# Patient Record
Sex: Female | Born: 1945 | Race: White | Hispanic: No | Smoking: Never smoker
Health system: Southern US, Community
[De-identification: ages and names within clinical notes are randomized; demographics above are authoritative.]

## PROBLEM LIST (undated history)

## (undated) DIAGNOSIS — I10 Essential (primary) hypertension: Secondary | ICD-10-CM

## (undated) DIAGNOSIS — J302 Other seasonal allergic rhinitis: Secondary | ICD-10-CM

## (undated) DIAGNOSIS — E039 Hypothyroidism, unspecified: Secondary | ICD-10-CM

## (undated) HISTORY — DX: Essential (primary) hypertension: I10

## (undated) HISTORY — DX: Hypothyroidism, unspecified: E03.9

## (undated) HISTORY — DX: Other seasonal allergic rhinitis: J30.2

---

## 2021-07-14 ENCOUNTER — Encounter: Payer: Self-pay | Admitting: Physician Assistant

## 2021-07-21 ENCOUNTER — Other Ambulatory Visit (INDEPENDENT_AMBULATORY_CARE_PROVIDER_SITE_OTHER): Payer: Medicare PPO

## 2021-07-21 ENCOUNTER — Ambulatory Visit: Payer: Medicare PPO | Admitting: Physician Assistant

## 2021-07-21 ENCOUNTER — Encounter: Payer: Self-pay | Admitting: Physician Assistant

## 2021-07-21 VITALS — BP 128/72 | HR 70 | Ht 61.0 in | Wt 151.0 lb

## 2021-07-21 DIAGNOSIS — G309 Alzheimer's disease, unspecified: Secondary | ICD-10-CM | POA: Diagnosis not present

## 2021-07-21 DIAGNOSIS — R413 Other amnesia: Secondary | ICD-10-CM

## 2021-07-21 DIAGNOSIS — F0280 Dementia in other diseases classified elsewhere without behavioral disturbance: Secondary | ICD-10-CM

## 2021-07-21 LAB — VITAMIN B12: Vitamin B-12: 263 pg/mL (ref 211–911)

## 2021-07-21 MED ORDER — DONEPEZIL HCL 10 MG PO TABS: ORAL_TABLET | ORAL | 11 refills | Status: DC

## 2021-07-21 NOTE — Progress Notes (Signed)
Please inform the patient that her B12 is on the lower side at 263, needs replenishment with 1 tablet, 1000 mcg daily, follow-up with primary care physician thank you
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
## 2021-07-21 NOTE — Patient Instructions (Signed)
It was a pleasure to see you today at our office.  ? ?Recommendations: ? ?Follow up in  3 months ?We will start donepezil half tablet (5mg ) daily for 2  weeks.  If you are tolerating the medication, then after 2 weeks, we will increase the dose to a full tablet of 10 mg daily.  Side effects include nausea, vomiting, diarrhea, vivid dreams, and muscle cramps.  Please call the clinic if you experience any of these symptoms.  ?MRI brain to look inside  ?Check B12 today ? ? ?Whom to call: ? ?Memory  decline, memory medications: Call out office [phone]  ? ?For psychiatric meds, mood meds: Please have your primary care physician manage these medications.  ? ?Counseling regarding caregiver distress, including caregiver depression, anxiety and issues regarding community resources, adult day care programs, adult living facilities, or memory care questions:   Feel free to contact Misty [phone], Social Worker at [phone] ?  ?For assessment of decision of mental capacity and competency:  Call Dr. [phone], geriatric psychiatrist at [phone] ? ?For guidance in geriatric dementia issues please call [facility] [phone] ? ?If you have any severe symptoms of a stroke, or other severe issues such as confusion,severe chills or fever, etc call 911 or go to the ER as you may need to be evaluate further ? ? ?Feel free to visit Facebook page " [employer]" for tips of how to care for people with memory problems.  ? ? ? ? ?RECOMMENDATIONS FOR ALL PATIENTS WITH MEMORY PROBLEMS: ?1. Continue to exercise (Recommend 30 minutes of walking everyday, or 3 hours every week) ?2. Increase social interactions - continue going to [place] and enjoy social gatherings with friends and family ?3. Eat healthy, avoid fried foods and eat more fruits and vegetables ?4. Maintain adequate blood pressure, blood sugar, and blood cholesterol level. Reducing the risk of stroke and cardiovascular disease also helps promoting  better memory. ?5. Avoid stressful situations. Live a simple life and avoid aggravations. Organize your time and prepare for the next day in anticipation. ?6. Sleep well, avoid any interruptions of sleep and avoid any distractions in the bedroom that may interfere with adequate sleep quality ?7. Avoid sugar, avoid sweets as there is a strong link between excessive sugar intake, diabetes, and cognitive impairment ?We discussed the Mediterranean diet, which has been shown to help patients reduce the risk of progressive memory disorders and reduces cardiovascular risk. This includes eating fish, eat fruits and green leafy vegetables, nuts like almonds and hazelnuts, walnuts, and also use olive oil. Avoid fast foods and fried foods as much as possible. Avoid sweets and sugar as sugar use has been linked to worsening of memory function. ? ?There is always a concern of gradual progression of memory problems. If this is the case, then we may need to adjust level of care according to patient needs. Support, both to the patient and caregiver, should then be put into place.  ? ? ?The Alzheimer?s Association is here all day, every day for people facing Alzheimer?s disease through our free 24/7 Helpline: [phone]. The Helpline provides reliable information and support to all those who need assistance, such as individuals living with memory loss, Alzheimer's or other dementia, caregivers, health care professionals and the public.  ?Our highly trained and knowledgeable staff can help you with: ?Understanding memory loss, dementia and Alzheimer's  ?Medications and other treatment options  ?General information about aging and brain health  ?Skills to provide quality care and  to find the best care from professionals  ?Legal, financial and living-arrangement decisions ?Our Helpline also features: ?Confidential care consultation provided by master's level clinicians who can help with decision-making support, crisis assistance and  education on issues families face every day  ?Help in a caller's preferred language using our translation service that features more than 200 languages and dialects  ?Referrals to local community programs, services and ongoing support ? ? ? ? ?FALL PRECAUTIONS: Be cautious when walking. Scan the area for obstacles that may increase the risk of trips and falls. When getting up in the mornings, sit up at the edge of the bed for a few minutes before getting out of bed. Consider elevating the bed at the head end to avoid drop of blood pressure when getting up. Walk always in a well-lit room (use night lights in the walls). Avoid area rugs or power cords from appliances in the middle of the walkways. Use a walker or a cane if necessary and consider physical therapy for balance exercise. Get your eyesight checked regularly. ? ?FINANCIAL OVERSIGHT: Supervision, especially oversight when making financial decisions or transactions is also recommended. ? ?HOME SAFETY: Consider the safety of the kitchen when operating appliances like stoves, microwave oven, and blender. Consider having supervision and share cooking responsibilities until no longer able to participate in those. Accidents with firearms and other hazards in the house should be identified and addressed as well. ? ? ?ABILITY TO BE LEFT ALONE: If patient is unable to contact 911 operator, consider using LifeLine, or when the need is there, arrange for someone to stay with patients. Smoking is a fire hazard, consider supervision or cessation. Risk of wandering should be assessed by caregiver and if detected at any point, supervision and safe proof recommendations should be instituted. ? ?MEDICATION SUPERVISION: Inability to self-administer medication needs to be constantly addressed. Implement a mechanism to ensure safe administration of the medications. ? ? ?DRIVING: Regarding driving, in patients with progressive memory problems, driving will be impaired. We advise  to have someone else do the driving if trouble finding directions or if minor accidents are reported. Independent driving assessment is available to determine safety of driving. ? ? ?If you are interested in the driving assessment, you can contact the following: ? ?[employer] [place] [phone] ? ?[facility] [phone] ? ?[facility] [phone] ? ?[facility] [phone] or [phone] ? ?  ? ? ?Mediterranean Diet ?A Mediterranean diet refers to food and lifestyle choices that are based on the traditions of countries located on the [place]. This way of eating has been shown to help prevent certain conditions and improve outcomes for people who have chronic diseases, like kidney disease and heart disease. ?What are tips for following this plan? ?Lifestyle  ?Cook and eat meals together with your family, when possible. ?Drink enough fluid to keep your urine clear or pale yellow. ?Be physically active every day. This includes: ?Aerobic exercise like running or swimming. ?Leisure activities like gardening, walking, or housework. ?Get 7-8 hours of sleep each night. ?If recommended by your health care provider, drink red wine in moderation. This means 1 glass a day for nonpregnant women and 2 glasses a day for men. A glass of wine equals 5 oz (150 mL). ?Reading food labels  ?Check the serving size of packaged foods. For foods such as rice and pasta, the serving size refers to the amount of cooked product, not dry. ?Check the total fat in packaged foods.  Avoid foods that have saturated fat or trans fats. ?Check the ingredients list for added sugars, such as corn syrup. ?Shopping  ?At the grocery store, buy most of your food from the areas near the walls of the store. This includes: ?Fresh fruits and vegetables (produce). ?Grains, beans, nuts, and seeds. Some of these may be available in unpackaged forms or large amounts (in bulk). ?Fresh  seafood. ?Poultry and eggs. ?Low-fat dairy products. ?Buy whole ingredients instead of prepackaged foods. ?Buy fresh fruits and vegetables in-season from local farmers markets. ?Buy frozen fruits and vegetables in re

## 2021-08-02 ENCOUNTER — Ambulatory Visit
Admission: RE | Admit: 2021-08-02 | Discharge: 2021-08-02 | Disposition: A | Discharge status: Home / Self Care | Payer: Medicare PPO | Source: Ambulatory Visit | Attending: Physician Assistant | Admitting: Physician Assistant

## 2021-08-02 DIAGNOSIS — R413 Other amnesia: Secondary | ICD-10-CM

## 2021-08-02 IMAGING — MR MR HEAD W/O CM
11 series · 48 of 48 positions shown · non-contrast
Comparison: No pertinent prior exams available for comparison.

CLINICAL DATA: Provided history: Memory loss.

EXAM:
MRI HEAD WITHOUT CONTRAST
TECHNIQUE: Multiplanar, multiecho pulse sequences of the brain and surrounding
structures were obtained without intravenous contrast.

[Series 5: T1 · sagittal · 4.0mm · 0.75mm/px · 2 of 31 slices shown (1 of 2)]
[im 1/31]
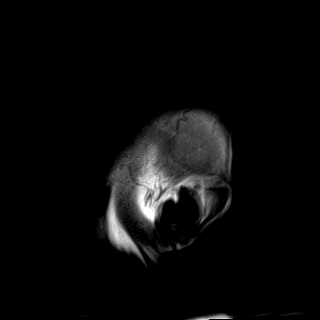
[im 31/31]
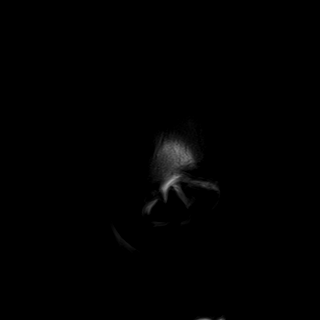

[Series 6: DWI · axial · 3.0mm · 0.94mm/px · z∈[-48,+92]mm · 10 of 160 slices shown (1 of 3)]
[im 1/160]
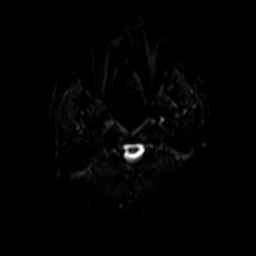
[im 18/160]
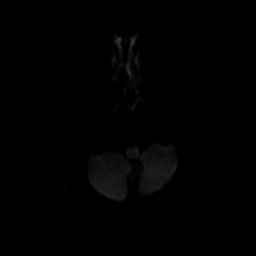
[im 36/160]
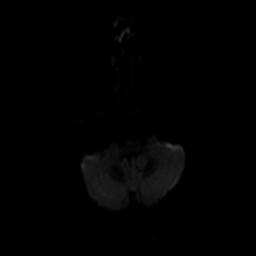
[im 54/160]
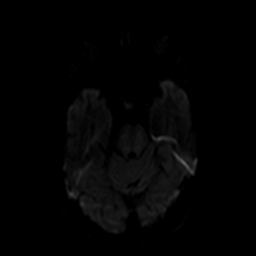
[im 71/160]
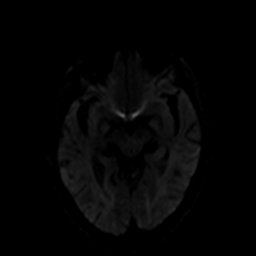
[im 89/160]
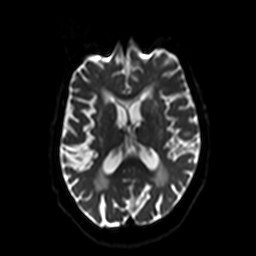
[im 107/160]
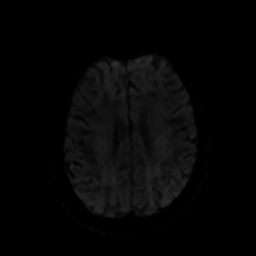
[im 124/160]
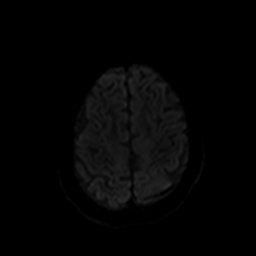
[im 142/160]
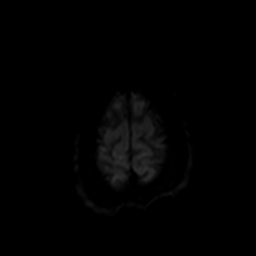
[im 160/160]
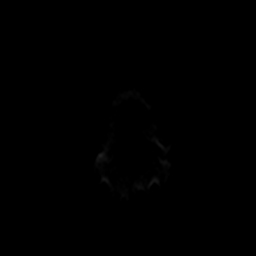

[Series 7: ax dwi_tracew · axial · 3.0mm · 0.94mm/px · z∈[-48,+92]mm · 5 of 80 slices shown]
[im 1/80]
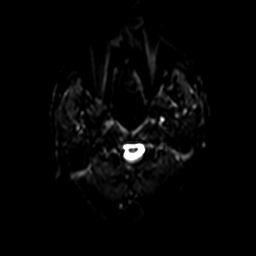
[im 20/80]
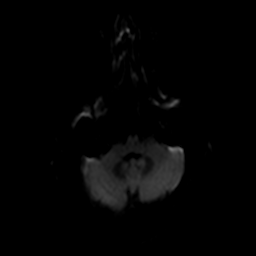
[im 40/80]
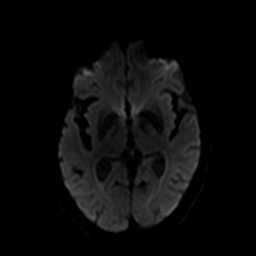
[im 60/80]
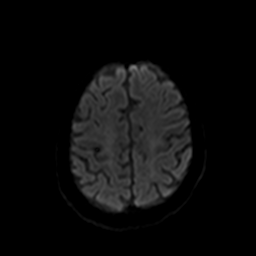
[im 80/80]
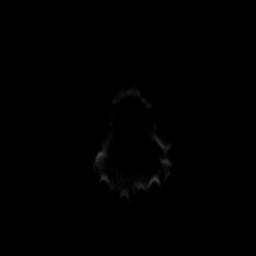

[Series 8: ax dwi_adc · axial · 3.0mm · 0.94mm/px · z∈[-48,+92]mm · 3 of 40 slices shown]
[im 1/40]
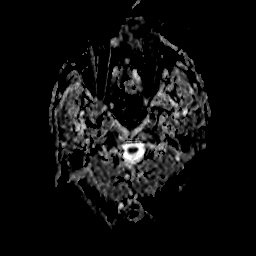
[im 20/40]
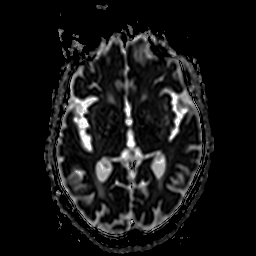
[im 40/40]
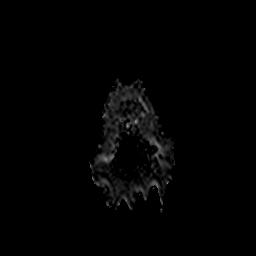

[Series 9: DWI · coronal · 5.0mm · 1.44mm/px · 4 of 60 slices shown (2 of 3)]
[im 1/60]
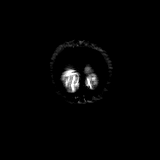
[im 20/60]
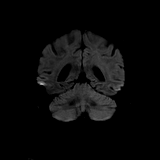
[im 40/60]
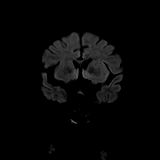
[im 60/60]
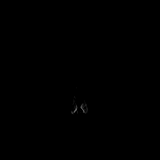

[Series 10: DWI · coronal · 5.0mm · 1.44mm/px · 2 of 29 slices shown (3 of 3)]
[im 1/29]
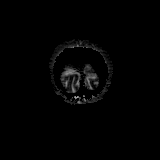
[im 29/29]
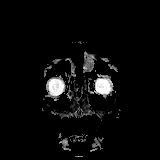

[Series 11: T2 · axial · 4.0mm · 0.36mm/px · z∈[-46,+89]mm · 2 of 26 slices shown (1 of 2)]
[im 1/26]
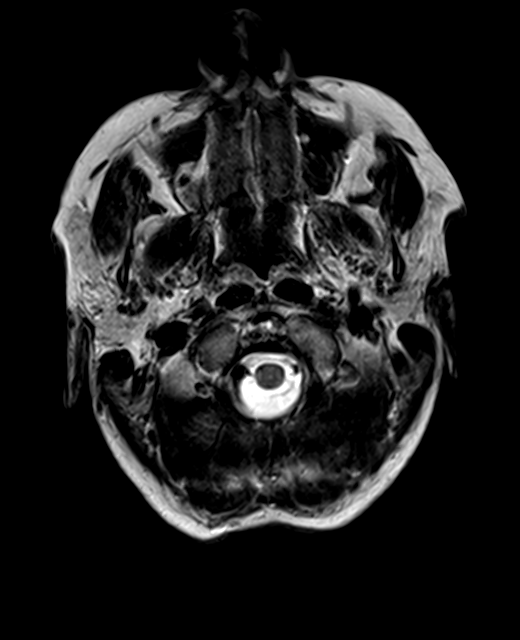
[im 26/26]
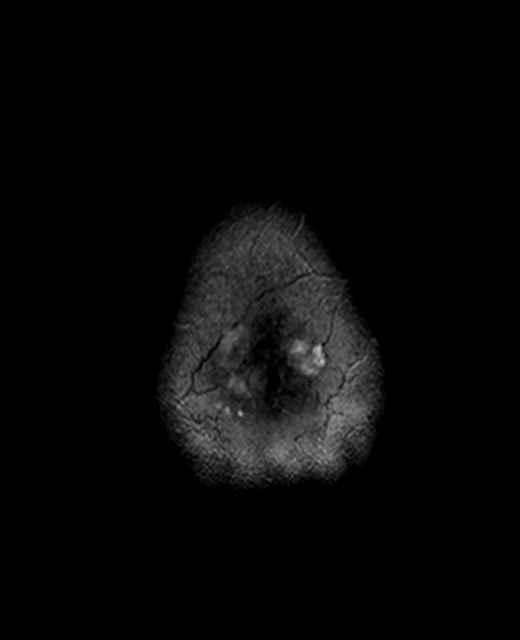

[Series 12: FLAIR · axial · 3.0mm · 0.72mm/px · z∈[-53,+96]mm · 2 of 26 slices shown]
[im 1/26]
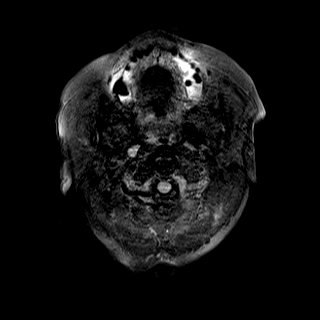
[im 26/26]
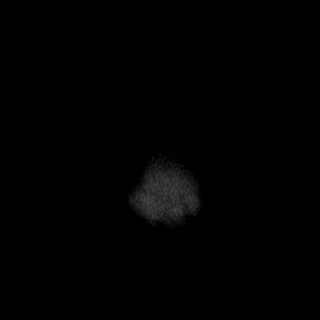

[Series 14: swi_images · axial · 1.5mm · 0.90mm/px · z∈[-49,+92]mm · 6 of 96 slices shown]
[im 1/96]
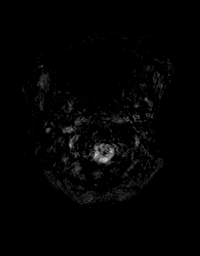
[im 20/96]
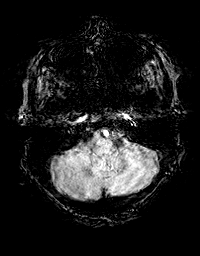
[im 39/96]
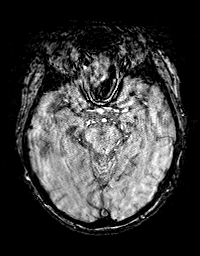
[im 58/96]
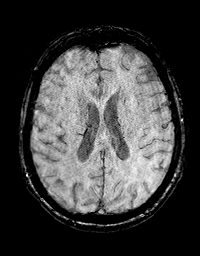
[im 77/96]
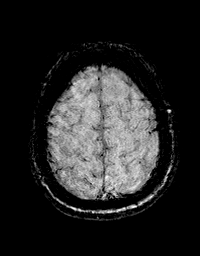
[im 96/96]
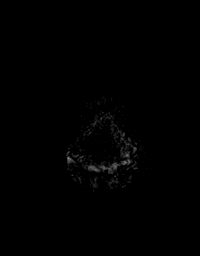

[Series 15: T1 · axial · 1.0mm · 0.94mm/px · z∈[-67,+90]mm · 10 of 158 slices shown (2 of 2)]
[im 1/158]
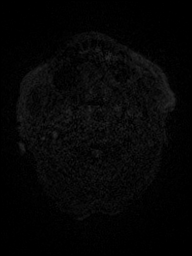
[im 18/158]
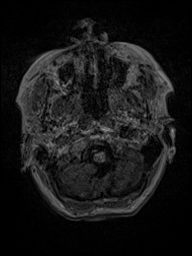
[im 35/158]
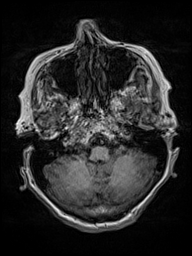
[im 53/158]
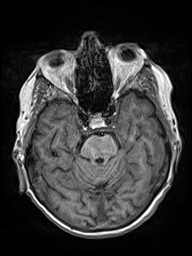
[im 70/158]
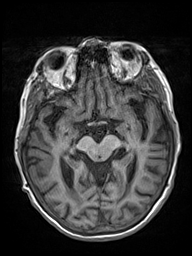
[im 88/158]
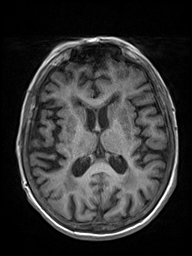
[im 105/158]
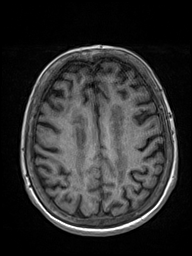
[im 123/158]
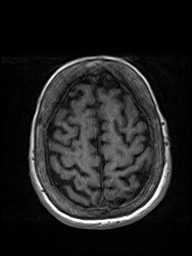
[im 140/158]
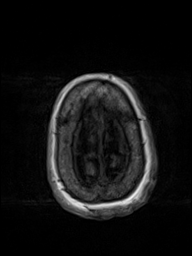
[im 158/158]
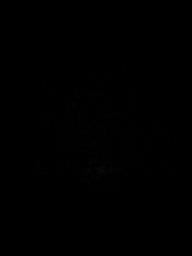

[Series 16: T2 · coronal · 4.5mm · 0.36mm/px · 2 of 30 slices shown (2 of 2)]
[im 1/30]
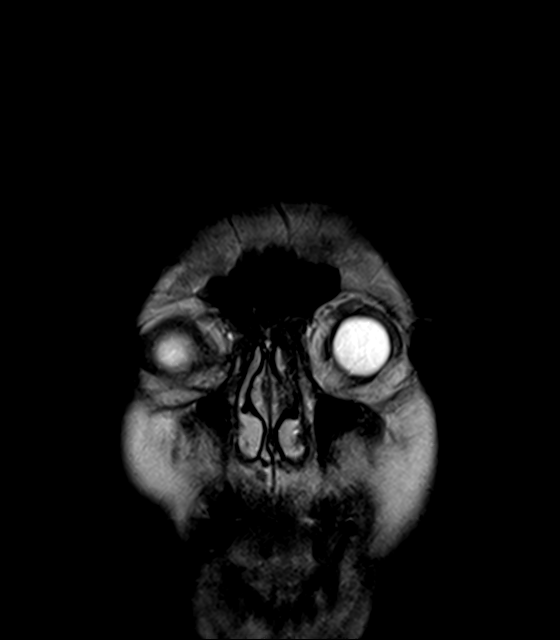
[im 30/30]
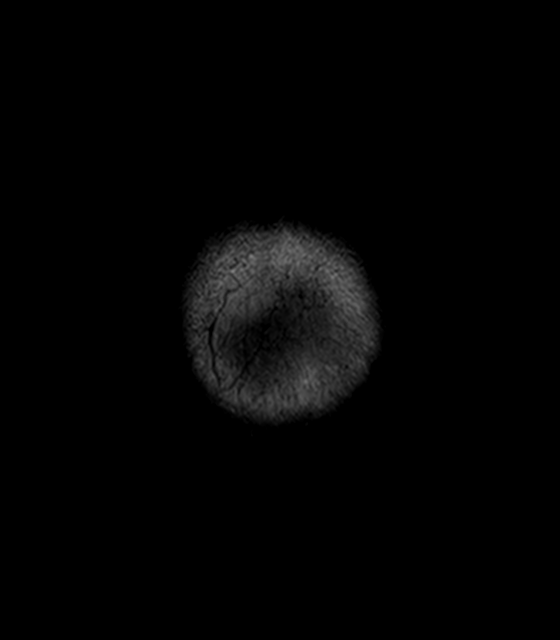

[48 of 48 positions shown; findings below may reference images not displayed]

FINDINGS: Mild-to-moderate intermittent motion degradation.

Brain:

Mild generalized cerebral atrophy.

Advanced patchy and confluent T2 FLAIR hyperintense signal
abnormality within the cerebral white matter, nonspecific but
compatible with chronic small vessel ischemic disease. Mild chronic
small vessel ischemic changes within the pons.

Multiple tiny chronic infarcts within the bilateral cerebellar
hemispheres.

There is no acute infarct.

No evidence of an intracranial mass.

No chronic intracranial blood products.

No extra-axial fluid collection.

No midline shift.

Vascular: Maintained flow voids within the proximal large arterial
vessels. Developmental venous anomaly within the posterior right
frontal lobe (anatomic variant).

Skull and upper cervical spine: No focal suspicious marrow lesion.

Sinuses/Orbits: No mass or acute finding within the imaged orbits.
Prior bilateral ocular lens replacement. Trace mucosal thickening
within the bilateral ethmoid and maxillary sinuses.
IMPRESSION: Intermittently motion degraded exam.

No evidence of acute intracranial abnormality.

Chronic small-vessel ischemic changes which are advanced in the
cerebral white matter, and mild in the pons.

Multiple tiny chronic infarcts within the bilateral cerebellar
hemispheres.

Mild generalized cerebral atrophy.

## 2021-08-05 NOTE — Progress Notes (Signed)
Please inform patient that her brain MRI does not show any acute findings. Only some chronic vessel changes, mild atrophy of the brain, and tiny tiny old infarcts in the cerebellum. Make sure you control your cardiovascular risk factors and talk to your doctor about taking a baby aspirin daily, to protect you. Thanks

## 2021-08-10 ENCOUNTER — Encounter: Payer: Self-pay | Admitting: Physician Assistant

## 2021-08-24 ENCOUNTER — Other Ambulatory Visit: Payer: Self-pay | Admitting: Physician Assistant

## 2021-08-24 MED ORDER — MEMANTINE HCL 5 MG PO TABS: ORAL_TABLET | ORAL | 11 refills | Status: DC

## 2021-08-24 NOTE — Telephone Encounter (Signed)
We can try memantine and see how she tolerates it. Will prescribe one tablet at night for 2 weeks, then increase to one tablet twice daily

## 2021-08-24 NOTE — Progress Notes (Signed)
Memantine 5 mg bid #60  11 refills sent to [facility]

## 2021-10-22 ENCOUNTER — Ambulatory Visit: Payer: Medicare PPO | Admitting: Physician Assistant

## 2021-10-30 ENCOUNTER — Encounter: Payer: Self-pay | Admitting: Physician Assistant

## 2021-10-30 ENCOUNTER — Ambulatory Visit: Payer: Medicare PPO | Admitting: Physician Assistant

## 2021-10-30 VITALS — BP 114/72 | HR 66 | Resp 18 | Ht 61.0 in | Wt 160.0 lb

## 2021-10-30 DIAGNOSIS — G309 Alzheimer's disease, unspecified: Secondary | ICD-10-CM

## 2021-10-30 DIAGNOSIS — F0280 Dementia in other diseases classified elsewhere without behavioral disturbance: Secondary | ICD-10-CM

## 2021-10-30 MED ORDER — MEMANTINE HCL 10 MG PO TABS: 10.0000 mg | ORAL_TABLET | Freq: Two times a day (BID) | ORAL | 11 refills | Status: DC

## 2021-10-30 NOTE — Patient Instructions (Signed)
It was a pleasure to see you today at our office.   Recommendations:  Follow up in 40months Increase memantine to 10 mg two times a day Continue primidone    Whom to call:  Memory  decline, memory medications: Call out office [phone]   For psychiatric meds, mood meds: Please have your primary care physician manage these medications.   Counseling regarding caregiver distress, including caregiver depression, anxiety and issues regarding community resources, adult day care programs, adult living facilities, or memory care questions:   Feel free to contact Misty Lisabeth Register, Social Worker at [phone]   For assessment of decision of mental capacity and competency:  Call Dr. Erick Blinks, geriatric psychiatrist at [phone]  For guidance in geriatric dementia issues please call [facility] [phone]  If you have any severe symptoms of a stroke, or other severe issues such as confusion,severe chills or fever, etc call 911 or go to the ER as you may need to be evaluate further   Feel free to visit Facebook page " [email or web address]" for tips of how to care for people with memory problems.      RECOMMENDATIONS FOR ALL PATIENTS WITH MEMORY PROBLEMS: 1. Continue to exercise (Recommend 30 minutes of walking everyday, or 3 hours every week) 2. Increase social interactions - continue going to [place] and enjoy social gatherings with friends and family 3. Eat healthy, avoid fried foods and eat more fruits and vegetables 4. Maintain adequate blood pressure, blood sugar, and blood cholesterol level. Reducing the risk of stroke and cardiovascular disease also helps promoting better memory. 5. Avoid stressful situations. Live a simple life and avoid aggravations. Organize your time and prepare for the next day in anticipation. 6. Sleep well, avoid any interruptions of sleep and avoid any distractions in the bedroom that may interfere with adequate sleep quality 7. Avoid sugar,  avoid sweets as there is a strong link between excessive sugar intake, diabetes, and cognitive impairment We discussed the Mediterranean diet, which has been shown to help patients reduce the risk of progressive memory disorders and reduces cardiovascular risk. This includes eating fish, eat fruits and green leafy vegetables, nuts like almonds and hazelnuts, walnuts, and also use olive oil. Avoid fast foods and fried foods as much as possible. Avoid sweets and sugar as sugar use has been linked to worsening of memory function.  There is always a concern of gradual progression of memory problems. If this is the case, then we may need to adjust level of care according to patient needs. Support, both to the patient and caregiver, should then be put into place.    The Alzheimer's Association is here all day, every day for people facing Alzheimer's disease through our free 24/7 Helpline: [phone]. The Helpline provides reliable information and support to all those who need assistance, such as individuals living with memory loss, Alzheimer's or other dementia, caregivers, health care professionals and the public.  Our highly trained and knowledgeable staff can help you with: Understanding memory loss, dementia and Alzheimer's  Medications and other treatment options  General information about aging and brain health  Skills to provide quality care and to find the best care from professionals  Legal, financial and living-arrangement decisions Our Helpline also features: Confidential care consultation provided by master's level clinicians who can help with decision-making support, crisis assistance and education on issues families face every day  Help in a caller's preferred language using our translation service that features more than 200 languages and dialects  Referrals to local community programs, services and ongoing support     FALL PRECAUTIONS: Be cautious when walking. Scan the area for  obstacles that may increase the risk of trips and falls. When getting up in the mornings, sit up at the edge of the bed for a few minutes before getting out of bed. Consider elevating the bed at the head end to avoid drop of blood pressure when getting up. Walk always in a well-lit room (use night lights in the walls). Avoid area rugs or power cords from appliances in the middle of the walkways. Use a walker or a cane if necessary and consider physical therapy for balance exercise. Get your eyesight checked regularly.  FINANCIAL OVERSIGHT: Supervision, especially oversight when making financial decisions or transactions is also recommended.  HOME SAFETY: Consider the safety of the kitchen when operating appliances like stoves, microwave oven, and blender. Consider having supervision and share cooking responsibilities until no longer able to participate in those. Accidents with firearms and other hazards in the house should be identified and addressed as well.   ABILITY TO BE LEFT ALONE: If patient is unable to contact 911 operator, consider using LifeLine, or when the need is there, arrange for someone to stay with patients. Smoking is a fire hazard, consider supervision or cessation. Risk of wandering should be assessed by caregiver and if detected at any point, supervision and safe proof recommendations should be instituted.  MEDICATION SUPERVISION: Inability to self-administer medication needs to be constantly addressed. Implement a mechanism to ensure safe administration of the medications.   DRIVING: Regarding driving, in patients with progressive memory problems, driving will be impaired. We advise to have someone else do the driving if trouble finding directions or if minor accidents are reported. Independent driving assessment is available to determine safety of driving.   If you are interested in the driving assessment, you can contact the following:  [employer] in [place]  [phone]  [facility] [phone]  [facility] [phone] or [phone]      Mediterranean Diet A Mediterranean diet refers to food and lifestyle choices that are based on the traditions of countries located on the [employer]. This way of eating has been shown to help prevent certain conditions and improve outcomes for people who have chronic diseases, like kidney disease and heart disease. What are tips for following this plan? Lifestyle  Cook and eat meals together with your family, when possible. Drink enough fluid to keep your urine clear or pale yellow. Be physically active every day. This includes: Aerobic exercise like running or swimming. Leisure activities like gardening, walking, or housework. Get 7-8 hours of sleep each night. If recommended by your health care provider, drink red wine in moderation. This means 1 glass a day for nonpregnant women and 2 glasses a day for men. A glass of wine equals 5 oz (150 mL). Reading food labels  Check the serving size of packaged foods. For foods such as rice and pasta, the serving size refers to the amount of cooked product, not dry. Check the total fat in packaged foods. Avoid foods that have saturated fat or trans fats. Check the ingredients list for added sugars, such as corn syrup. Shopping  At the grocery store, buy most of your food from the areas near the walls of the store. This includes: Fresh fruits and vegetables (produce). Grains, beans, nuts, and seeds. Some of these may be available in unpackaged forms  or large amounts (in bulk). Fresh seafood. Poultry and eggs. Low-fat dairy products. Buy whole ingredients instead of prepackaged foods. Buy fresh fruits and vegetables in-season from local farmers markets. Buy frozen fruits and vegetables in resealable bags. If you do not have access to quality fresh seafood, buy precooked frozen shrimp or  canned fish, such as tuna, salmon, or sardines. Buy small amounts of raw or cooked vegetables, salads, or olives from the deli or salad bar at your store. Stock your pantry so you always have certain foods on hand, such as olive oil, canned tuna, canned tomatoes, rice, pasta, and beans. Cooking  Cook foods with extra-virgin olive oil instead of using butter or other vegetable oils. Have meat as a side dish, and have vegetables or grains as your main dish. This means having meat in small portions or adding small amounts of meat to foods like pasta or stew. Use beans or vegetables instead of meat in common dishes like chili or lasagna. Experiment with different cooking methods. Try roasting or broiling vegetables instead of steaming or sauteing them. Add frozen vegetables to soups, stews, pasta, or rice. Add nuts or seeds for added healthy fat at each meal. You can add these to yogurt, salads, or vegetable dishes. Marinate fish or vegetables using olive oil, lemon juice, garlic, and fresh herbs. Meal planning  Plan to eat 1 vegetarian meal one day each week. Try to work up to 2 vegetarian meals, if possible. Eat seafood 2 or more times a week. Have healthy snacks readily available, such as: Vegetable sticks with hummus. Greek yogurt. Fruit and nut trail mix. Eat balanced meals throughout the week. This includes: Fruit: 2-3 servings a day Vegetables: 4-5 servings a day Low-fat dairy: 2 servings a day Fish, poultry, or lean meat: 1 serving a day Beans and legumes: 2 or more servings a week Nuts and seeds: 1-2 servings a day Whole grains: 6-8 servings a day Extra-virgin olive oil: 3-4 servings a day Limit red meat and sweets to only a few servings a month What are my food choices? Mediterranean diet Recommended Grains: Whole-grain pasta. Brown rice. Bulgar wheat. Polenta. Couscous. Whole-wheat bread. Orpah Cobb. Vegetables: Artichokes. Beets. Broccoli. Cabbage. Carrots. Eggplant.  Green beans. Chard. Kale. Spinach. Onions. Leeks. Peas. Squash. Tomatoes. Peppers. Radishes. Fruits: Apples. Apricots. Avocado. Berries. Bananas. Cherries. Dates. Figs. Grapes. Lemons. Melon. Oranges. Peaches. Plums. Pomegranate. Meats and other protein foods: Beans. Almonds. Sunflower seeds. Pine nuts. Peanuts. Cod. Salmon. Scallops. Shrimp. Tuna. Tilapia. Clams. Oysters. Eggs. Dairy: Low-fat milk. Cheese. Greek yogurt. Beverages: Water. Red wine. Herbal tea. Fats and oils: Extra virgin olive oil. Avocado oil. Grape seed oil. Sweets and desserts: [place] yogurt with honey. Baked apples. Poached pears. Trail mix. Seasoning and other foods: Basil. Cilantro. Coriander. Cumin. Mint. Parsley. Sage. Rosemary. Tarragon. Garlic. Oregano. Thyme. Pepper. Balsalmic vinegar. Tahini. Hummus. Tomato sauce. Olives. Mushrooms. Limit these Grains: Prepackaged pasta or rice dishes. Prepackaged cereal with added sugar. Vegetables: Deep fried potatoes (french fries). Fruits: Fruit canned in syrup. Meats and other protein foods: Beef. Pork. Lamb. Poultry with skin. Hot dogs. Tomasa Blase. Dairy: Ice cream. Sour cream. Whole milk. Beverages: Juice. Sugar-sweetened soft drinks. Beer. Liquor and spirits. Fats and oils: Butter. Canola oil. Vegetable oil. Beef fat (tallow). Lard. Sweets and desserts: Cookies. Cakes. Pies. Candy. Seasoning and other foods: Mayonnaise. Premade sauces and marinades. The items listed may not be a complete list. Talk with your dietitian about what dietary choices are right for you. Summary The Mediterranean diet includes both  food and lifestyle choices. Eat a variety of fresh fruits and vegetables, beans, nuts, seeds, and whole grains. Limit the amount of red meat and sweets that you eat. Talk with your health care provider about whether it is safe for you to drink red wine in moderation. This means 1 glass a day for nonpregnant women and 2 glasses a day for men. A glass of wine equals 5 oz (150  mL). This information is not intended to replace advice given to you by your health care provider. Make sure you discuss any questions you have with your health care provider. Document Released: 10/16/2015 Document Revised: 11/18/2015 Document Reviewed: 10/16/2015 Elsevier Interactive Patient Education  2017 [employer].

## 2021-10-30 NOTE — Progress Notes (Unsigned)
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 RH female seen today in follow up for memory loss. Patient is currently on MMSE today is   /30  with delayed recall  /3       Follow up in 6  months. Continue memantine 5 mg bid  Case discussed with Dr. Karel Jarvis who agrees with the plan   76 y.o. 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  MoCA today is 13/30, with delayed recall 1/5, and then deficiencies in most areas including visuospatial executive 1/5, memory, attention, language and abstraction.  She initially was seen at [facility] in [place] on Jan 2023, and after neuropsychological evaluation, it was indicated that she had mild cognitive impairment.  During today's visit with evaluation and MOCA 13/30, symptoms concerning for mild dementia likely due to vascular and Alzheimer's disease.  She is not on antidepressant medications.***       Subjective:    This patient is accompanied in the office by  who supplements the history.  Previous records as well as any outside records available were reviewed prior to todays visit.    Any changes in memory since last visit? Forgets people's name, or how to use the remote, cshe wanted to cook it instead of defrosting .  A lot of things that she was not doing before, like cleaning clothes on the floor, she put a blanket on the ground, etc. She is hoarding more.  Patient lives with: daughter and her grand-daughter Lelon Mast  repeats oneself?  Endorsed sometimes is about retelling a story  Disoriented when walking into a room?  Daughter reports that when she is unfamiliar places she may forget to close the door in a restaurant   Leaving objects in unusual places?  Patient denies   Ambulates  with difficulty?   Patient denies   Recent falls?  Patient denies   Any head injuries?  Patient denies   History of seizures?   Patient denies   Wandering behavior?  Patient denies   Patient drives?   Patient no longer drives  Any mood changes since last visit?  Any worsening depression?:  Patient denies   Hallucinations?  Patient denies   Paranoia?  Patient denies , but she worries obsessively about things. She bought 3 cakes.  Patient reports that sleeps well without vivid dreams, REM behavior or sleepwalking   History of sleep apnea?  Wears CPAP every night  Any hygiene concerns?  Patient denies  Lavetta Nielsen to remind her sometimes Independent of bathing and dressing?  Endorsed sometimes she picks things they don't match Does the patient needs help with medications?  Dtr fills the pilbox  Who is in charge of the finances?   is in charge    Any changes in appetite?  Patient denies   Patient have trouble swallowing? Patient denies   Does the patient cook?  Patient denies   Any kitchen accidents such as leaving the stove on? Patient denies   Any headaches?  Patient denies   Double vision? Patient denies   Any focal numbness or tingling?  Patient denies   Chronic back pain Patient denies   Unilateral weakness?  Patient denies   Any tremors?  R  hand tremor  Any history of anosmia?  Patient denies   Any incontinence of urine? Wears depends  Any bowel dysfunction?   Constipation    PREVIOUS MEDICATIONS:   CURRENT MEDICATIONS:  Outpatient Encounter Medications as of 10/30/2021  Medication Sig   amLODipine (NORVASC) 10 MG tablet Take by mouth.   levothyroxine (SYNTHROID) 100 MCG tablet Take by mouth. Takes every other day   levothyroxine (SYNTHROID) 112 MCG tablet Take by mouth. Takes every other day   losartan (COZAAR) 50 MG tablet Take by mouth.   Melatonin 1 MG CAPS Take by mouth.   memantine (NAMENDA) 5 MG tablet Take  1 tablet (5 mg at night) for 2 weeks, then increase to 1 tablet (5 mg) twice a day   omeprazole (PRILOSEC) 40 MG capsule Take by mouth.   potassium chloride (KLOR-CON) 8 MEQ tablet Take by mouth.   pravastatin (PRAVACHOL) 40 MG tablet Take by mouth.   predniSONE (DELTASONE) 20 MG tablet Take by mouth.   primidone (MYSOLINE) 50 MG tablet Take by mouth.   No facility-administered encounter medications on file as of 10/30/2021.        No data to display            07/21/2021   10:00 AM  Montreal Cognitive Assessment   Visuospatial/ Executive (0/5) 1  Naming (0/3) 3  Attention: Read list of digits (0/2) 0  Attention: Read list of letters (0/1) 0  Attention: Serial 7 subtraction starting at 100 (0/3) 0  Language: Repeat phrase (0/2) 1  Language : Fluency (0/1) 0  Abstraction (0/2) 1  Delayed Recall (0/5) 1  Orientation (0/6) 5  Total 12  Adjusted Score (based on education) 13    Objective:     PHYSICAL EXAMINATION:    VITALS:  There were no vitals filed for this visit.  GEN:  The patient appears stated age and is in NAD. HEENT:  Normocephalic, atraumatic.   Neurological examination:  General: NAD, well-groomed, appears stated age. Orientation: The patient is alert. Oriented to person, place and date Cranial nerves: There is good facial symmetry.The speech is fluent and clear. No aphasia or dysarthria. Fund of knowledge is appropriate. Recent and remote memory are impaired. Attention and concentration are reduced.  Able to name objects and repeat phrases.  Hearing is intact to conversational tone.    Sensation: Sensation is intact to light touch throughout Motor: Strength is at least antigravity x4. Tremors: none  DTR's 2/4 in UE/LE     Movement examination: Tone: There is normal tone in the UE/LE Abnormal movements:  no tremor.  No myoclonus.  No asterixis.   Coordination:  There is no decremation with RAM's. Normal finger to nose  Gait and Station: The patient has  no difficulty arising out of a deep-seated chair without the use of the hands. The patient's stride length is good.  Gait is cautious and narrow.    Thank you for allowing [place] the opportunity to participate in the care of this nice patient. Please do not hesitate to contact us for any questions or concerns.   Total time spent on today's visit was *** minutes dedicated to this patient today, preparing to see patient, examining the patient, ordering tests and/or medications and counseling the patient, documenting clinical information in the EHR or other health record, independently interpreting results and communicating results to the patient/family, discussing treatment and goals, answering patient's questions and coordinating care.  Cc:  Malen Gauze, PA-C  Marlowe Kays 10/30/2021  12:55 PM

## 2021-11-03 ENCOUNTER — Encounter: Payer: Self-pay | Admitting: Physician Assistant

## 2021-11-04 ENCOUNTER — Other Ambulatory Visit: Payer: Self-pay | Admitting: Physician Assistant

## 2021-11-04 ENCOUNTER — Other Ambulatory Visit: Payer: Self-pay

## 2021-11-04 ENCOUNTER — Encounter: Payer: Self-pay | Admitting: Physician Assistant

## 2021-11-04 MED ORDER — PRIMIDONE 50 MG PO TABS: 100.0000 mg | ORAL_TABLET | Freq: Every evening | ORAL | 3 refills | Status: DC

## 2021-11-04 NOTE — Telephone Encounter (Signed)
Called and spoke tot patients daughter POA and she is going to double check the dose and mg of the primidone

## 2021-12-30 ENCOUNTER — Encounter: Payer: Self-pay | Admitting: Physician Assistant

## 2022-01-05 ENCOUNTER — Ambulatory Visit: Payer: Medicare PPO | Admitting: Physician Assistant

## 2022-01-05 ENCOUNTER — Encounter: Payer: Self-pay | Admitting: Physician Assistant

## 2022-01-05 VITALS — BP 125/76 | HR 65 | Resp 18 | Ht 61.0 in | Wt 172.0 lb

## 2022-01-05 DIAGNOSIS — R251 Tremor, unspecified: Secondary | ICD-10-CM | POA: Diagnosis not present

## 2022-01-05 DIAGNOSIS — F0280 Dementia in other diseases classified elsewhere without behavioral disturbance: Secondary | ICD-10-CM

## 2022-01-05 DIAGNOSIS — G309 Alzheimer's disease, unspecified: Secondary | ICD-10-CM | POA: Diagnosis not present

## 2022-01-05 NOTE — Patient Instructions (Addendum)
It was a pleasure to see you today at our office.   Recommendations:  Follow up 2/28 at 11:30  Continue  memantine to 10 mg two times a day Continue primidone 100 mg at night  Recommend stress management     Whom to call:  Memory  decline, memory medications: Call out office [phone]   For psychiatric meds, mood meds: Please have your primary care physician manage these medications.   Counseling regarding caregiver distress, including caregiver depression, anxiety and issues regarding community resources, adult day care programs, adult living facilities, or memory care questions:   Feel free to contact [place], Social Worker at [phone]   For assessment of decision of mental capacity and competency:  Call Dr. Anthoney Harada, geriatric psychiatrist at [phone]  For guidance in geriatric dementia issues please call [facility] [phone]  If you have any severe symptoms of a stroke, or other severe issues such as confusion,severe chills or fever, etc call 911 or go to the ER as you may need to be evaluate further   Feel free to visit Facebook page " [email or web address]" for tips of how to care for people with memory problems.      RECOMMENDATIONS FOR ALL PATIENTS WITH MEMORY PROBLEMS: 1. Continue to exercise (Recommend 30 minutes of walking everyday, or 3 hours every week) 2. Increase social interactions - continue going to [place] and enjoy social gatherings with friends and family 3. Eat healthy, avoid fried foods and eat more fruits and vegetables 4. Maintain adequate blood pressure, blood sugar, and blood cholesterol level. Reducing the risk of stroke and cardiovascular disease also helps promoting better memory. 5. Avoid stressful situations. Live a simple life and avoid aggravations. Organize your time and prepare for the next day in anticipation. 6. Sleep well, avoid any interruptions of sleep and avoid any distractions in the bedroom that may  interfere with adequate sleep quality 7. Avoid sugar, avoid sweets as there is a strong link between excessive sugar intake, diabetes, and cognitive impairment We discussed the Mediterranean diet, which has been shown to help patients reduce the risk of progressive memory disorders and reduces cardiovascular risk. This includes eating fish, eat fruits and green leafy vegetables, nuts like almonds and hazelnuts, walnuts, and also use olive oil. Avoid fast foods and fried foods as much as possible. Avoid sweets and sugar as sugar use has been linked to worsening of memory function.  There is always a concern of gradual progression of memory problems. If this is the case, then we may need to adjust level of care according to patient needs. Support, both to the patient and caregiver, should then be put into place.    The Alzheimer's Association is here all day, every day for people facing Alzheimer's disease through our free 24/7 Helpline: [phone]. The Helpline provides reliable information and support to all those who need assistance, such as individuals living with memory loss, Alzheimer's or other dementia, caregivers, health care professionals and the public.  Our highly trained and knowledgeable staff can help you with: Understanding memory loss, dementia and Alzheimer's  Medications and other treatment options  General information about aging and brain health  Skills to provide quality care and to find the best care from professionals  Legal, financial and living-arrangement decisions Our Helpline also features: Confidential care consultation provided by master's level clinicians who can help with decision-making support, crisis assistance and education on issues families face every day  Help in a caller's preferred language  using our translation service that features more than 200 languages and dialects  Referrals to local community programs, services and ongoing support     FALL  PRECAUTIONS: Be cautious when walking. Scan the area for obstacles that may increase the risk of trips and falls. When getting up in the mornings, sit up at the edge of the bed for a few minutes before getting out of bed. Consider elevating the bed at the head end to avoid drop of blood pressure when getting up. Walk always in a well-lit room (use night lights in the walls). Avoid area rugs or power cords from appliances in the middle of the walkways. Use a walker or a cane if necessary and consider physical therapy for balance exercise. Get your eyesight checked regularly.  FINANCIAL OVERSIGHT: Supervision, especially oversight when making financial decisions or transactions is also recommended.  HOME SAFETY: Consider the safety of the kitchen when operating appliances like stoves, microwave oven, and blender. Consider having supervision and share cooking responsibilities until no longer able to participate in those. Accidents with firearms and other hazards in the house should be identified and addressed as well.   ABILITY TO BE LEFT ALONE: If patient is unable to contact 911 operator, consider using LifeLine, or when the need is there, arrange for someone to stay with patients. Smoking is a fire hazard, consider supervision or cessation. Risk of wandering should be assessed by caregiver and if detected at any point, supervision and safe proof recommendations should be instituted.  MEDICATION SUPERVISION: Inability to self-administer medication needs to be constantly addressed. Implement a mechanism to ensure safe administration of the medications.   DRIVING: Regarding driving, in patients with progressive memory problems, driving will be impaired. We advise to have someone else do the driving if trouble finding directions or if minor accidents are reported. Independent driving assessment is available to determine safety of driving.   If you are interested in the driving assessment, you can contact  the following:  [employer] in [place]  [place] [phone] or [phone]      [place] refers to food and lifestyle choices that are based on the traditions of countries located on the [employer]. This way of eating has been shown to help prevent certain conditions and improve outcomes for people who have chronic diseases, like kidney disease and heart disease. What are tips for following this plan? Lifestyle  Cook and eat meals together with your family, when possible. Drink enough fluid to keep your urine clear or pale yellow. Be physically active every day. This includes: Aerobic exercise like running or swimming. Leisure activities like gardening, walking, or housework. Get 7-8 hours of sleep each night. If recommended by your health care provider, drink red wine in moderation. This means 1 glass a day for nonpregnant women and 2 glasses a day for men. A glass of wine equals 5 oz (150 mL). Reading food labels  Check the serving size of packaged foods. For foods such as rice and pasta, the serving size refers to the amount of cooked product, not dry. Check the total fat in packaged foods. Avoid foods that have saturated fat or trans fats. Check the ingredients list for added sugars, such as corn syrup. Shopping  At the grocery store, buy most of your food from the areas near the walls of the store. This includes: Fresh fruits and vegetables (produce). Grains,  beans, nuts, and seeds. Some of these may be available in unpackaged forms or large amounts (in bulk). Fresh seafood. Poultry and eggs. Low-fat dairy products. Buy whole ingredients instead of prepackaged foods. Buy fresh fruits and vegetables in-season from local farmers markets. Buy frozen fruits and vegetables in resealable bags. If you do not have access to  quality fresh seafood, buy precooked frozen shrimp or canned fish, such as tuna, salmon, or sardines. Buy small amounts of raw or cooked vegetables, salads, or olives from the deli or salad bar at your store. Stock your pantry so you always have certain foods on hand, such as olive oil, canned tuna, canned tomatoes, rice, pasta, and beans. Cooking  Cook foods with extra-virgin olive oil instead of using butter or other vegetable oils. Have meat as a side dish, and have vegetables or grains as your main dish. This means having meat in small portions or adding small amounts of meat to foods like pasta or stew. Use beans or vegetables instead of meat in common dishes like chili or lasagna. Experiment with different cooking methods. Try roasting or broiling vegetables instead of steaming or sauteing them. Add frozen vegetables to soups, stews, pasta, or rice. Add nuts or seeds for added healthy fat at each meal. You can add these to yogurt, salads, or vegetable dishes. Marinate fish or vegetables using olive oil, lemon juice, garlic, and fresh herbs. Meal planning  Plan to eat 1 vegetarian meal one day each week. Try to work up to 2 vegetarian meals, if possible. Eat seafood 2 or more times a week. Have healthy snacks readily available, such as: Vegetable sticks with hummus. Greek yogurt. Fruit and nut trail mix. Eat balanced meals throughout the week. This includes: Fruit: 2-3 servings a day Vegetables: 4-5 servings a day Low-fat dairy: 2 servings a day Fish, poultry, or lean meat: 1 serving a day Beans and legumes: 2 or more servings a week Nuts and seeds: 1-2 servings a day Whole grains: 6-8 servings a day Extra-virgin olive oil: 3-4 servings a day Limit red meat and sweets to only a few servings a month What are my food choices? Mediterranean diet Recommended Grains: Whole-grain pasta. Brown rice. Bulgar wheat. Polenta. Couscous. Whole-wheat bread. [place]. Vegetables:  Artichokes. Beets. Broccoli. Cabbage. Carrots. Eggplant. Green beans. Chard. Kale. Spinach. Onions. Leeks. Peas. Squash. Tomatoes. Peppers. Radishes. Fruits: Apples. Apricots. Avocado. Berries. Bananas. Cherries. Dates. Figs. Grapes. Lemons. Melon. Oranges. Peaches. Plums. Pomegranate. Meats and other protein foods: Beans. Almonds. Sunflower seeds. Pine nuts. Peanuts. [place]. Salmon. Scallops. Shrimp. [place]. Tilapia. Clams. Oysters. Eggs. Dairy: Low-fat milk. Cheese. Greek yogurt. Beverages: Water. Red wine. Herbal tea. Fats and oils: Extra virgin olive oil. Avocado oil. Grape seed oil. Sweets and desserts: [place] yogurt with honey. Baked apples. Poached pears. Trail mix. Seasoning and other foods: Basil. Cilantro. Coriander. Cumin. Mint. Parsley. Sage. Rosemary. Tarragon. Garlic. Oregano. Thyme. Pepper. Balsalmic vinegar. Tahini. Hummus. Tomato sauce. Olives. Mushrooms. Limit these Grains: Prepackaged pasta or rice dishes. Prepackaged cereal with added sugar. Vegetables: Deep fried potatoes (french fries). Fruits: Fruit canned in syrup. Meats and other protein foods: Beef. Pork. Lamb. Poultry with skin. Hot dogs. [place] Salines. Dairy: Ice cream. Sour cream. Whole milk. Beverages: Juice. Sugar-sweetened soft drinks. Beer. Liquor and spirits. Fats and oils: Butter. Canola oil. Vegetable oil. Beef fat (tallow). Lard. Sweets and desserts: Cookies. Cakes. Pies. Candy. Seasoning and other foods: Mayonnaise. Premade sauces and marinades. The items listed may not be a complete list. Talk with your dietitian about  what dietary choices are right for you. Summary The Mediterranean diet includes both food and lifestyle choices. Eat a variety of fresh fruits and vegetables, beans, nuts, seeds, and whole grains. Limit the amount of red meat and sweets that you eat. Talk with your health care provider about whether it is safe for you to drink red wine in moderation. This means 1 glass a day for nonpregnant women and 2  glasses a day for men. A glass of wine equals 5 oz (150 mL). This information is not intended to replace advice given to you by your health care provider. Make sure you discuss any questions you have with your health care provider. Document Released: 10/16/2015 Document Revised: 11/18/2015 Document Reviewed: 10/16/2015 Elsevier Interactive Patient Education  2017 [employer].
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
## 2022-01-12 ENCOUNTER — Ambulatory Visit: Payer: Medicare PPO | Admitting: Physician Assistant

## 2022-05-03 ENCOUNTER — Ambulatory Visit: Payer: Medicare PPO | Admitting: Physician Assistant

## 2022-05-05 ENCOUNTER — Encounter: Payer: Self-pay | Admitting: Physician Assistant

## 2022-05-05 ENCOUNTER — Ambulatory Visit: Payer: Medicare PPO | Admitting: Physician Assistant

## 2022-05-05 VITALS — BP 144/72 | HR 74 | Resp 18 | Ht 61.0 in | Wt 180.0 lb

## 2022-05-05 DIAGNOSIS — G309 Alzheimer's disease, unspecified: Secondary | ICD-10-CM

## 2022-05-05 DIAGNOSIS — R251 Tremor, unspecified: Secondary | ICD-10-CM

## 2022-05-05 DIAGNOSIS — F0280 Dementia in other diseases classified elsewhere without behavioral disturbance: Secondary | ICD-10-CM | POA: Diagnosis not present

## 2022-05-05 MED ORDER — MEMANTINE HCL 10 MG PO TABS: 10.0000 mg | ORAL_TABLET | Freq: Two times a day (BID) | ORAL | 3 refills | Status: DC

## 2022-05-05 MED ORDER — PRIMIDONE 50 MG PO TABS: 100.0000 mg | ORAL_TABLET | Freq: Every evening | ORAL | 3 refills | Status: DC

## 2022-05-05 NOTE — Patient Instructions (Addendum)
It was a pleasure to see you today at our office.   Recommendations:  Follow up 9/6 at 11:30  Continue  memantine to 10 mg two times a day Continue primidone 100 mg at night  Recommend stress management     Whom to call:  Memory  decline, memory medications: Call out office [phone]   For psychiatric meds, mood meds: Please have your primary care physician manage these medications.   Counseling regarding caregiver distress, including caregiver depression, anxiety and issues regarding community resources, adult day care programs, adult living facilities, or memory care questions:   Feel free to contact [place], Social Worker at [phone]   For assessment of decision of mental capacity and competency:  Call Dr. Anthoney Harada, geriatric psychiatrist at [phone]  For guidance in geriatric dementia issues please call [facility] [phone]  If you have any severe symptoms of a stroke, or other severe issues such as confusion,severe chills or fever, etc call 911 or go to the ER as you may need to be evaluate further   Feel free to visit Facebook page " [email or web address]" for tips of how to care for people with memory problems.      RECOMMENDATIONS FOR ALL PATIENTS WITH MEMORY PROBLEMS: 1. Continue to exercise (Recommend 30 minutes of walking everyday, or 3 hours every week) 2. Increase social interactions - continue going to [place] and enjoy social gatherings with friends and family 3. Eat healthy, avoid fried foods and eat more fruits and vegetables 4. Maintain adequate blood pressure, blood sugar, and blood cholesterol level. Reducing the risk of stroke and cardiovascular disease also helps promoting better memory. 5. Avoid stressful situations. Live a simple life and avoid aggravations. Organize your time and prepare for the next day in anticipation. 6. Sleep well, avoid any interruptions of sleep and avoid any distractions in the bedroom that may  interfere with adequate sleep quality 7. Avoid sugar, avoid sweets as there is a strong link between excessive sugar intake, diabetes, and cognitive impairment We discussed the Mediterranean diet, which has been shown to help patients reduce the risk of progressive memory disorders and reduces cardiovascular risk. This includes eating fish, eat fruits and green leafy vegetables, nuts like almonds and hazelnuts, walnuts, and also use olive oil. Avoid fast foods and fried foods as much as possible. Avoid sweets and sugar as sugar use has been linked to worsening of memory function.  There is always a concern of gradual progression of memory problems. If this is the case, then we may need to adjust level of care according to patient needs. Support, both to the patient and caregiver, should then be put into place.    The Alzheimer's Association is here all day, every day for people facing Alzheimer's disease through our free 24/7 Helpline: [phone]. The Helpline provides reliable information and support to all those who need assistance, such as individuals living with memory loss, Alzheimer's or other dementia, caregivers, health care professionals and the public.  Our highly trained and knowledgeable staff can help you with: Understanding memory loss, dementia and Alzheimer's  Medications and other treatment options  General information about aging and brain health  Skills to provide quality care and to find the best care from professionals  Legal, financial and living-arrangement decisions Our Helpline also features: Confidential care consultation provided by master's level clinicians who can help with decision-making support, crisis assistance and education on issues families face every day  Help in a caller's preferred language  using our translation service that features more than 200 languages and dialects  Referrals to local community programs, services and ongoing support     FALL  PRECAUTIONS: Be cautious when walking. Scan the area for obstacles that may increase the risk of trips and falls. When getting up in the mornings, sit up at the edge of the bed for a few minutes before getting out of bed. Consider elevating the bed at the head end to avoid drop of blood pressure when getting up. Walk always in a well-lit room (use night lights in the walls). Avoid area rugs or power cords from appliances in the middle of the walkways. Use a walker or a cane if necessary and consider physical therapy for balance exercise. Get your eyesight checked regularly.  FINANCIAL OVERSIGHT: Supervision, especially oversight when making financial decisions or transactions is also recommended.  HOME SAFETY: Consider the safety of the kitchen when operating appliances like stoves, microwave oven, and blender. Consider having supervision and share cooking responsibilities until no longer able to participate in those. Accidents with firearms and other hazards in the house should be identified and addressed as well.   ABILITY TO BE LEFT ALONE: If patient is unable to contact 911 operator, consider using LifeLine, or when the need is there, arrange for someone to stay with patients. Smoking is a fire hazard, consider supervision or cessation. Risk of wandering should be assessed by caregiver and if detected at any point, supervision and safe proof recommendations should be instituted.  MEDICATION SUPERVISION: Inability to self-administer medication needs to be constantly addressed. Implement a mechanism to ensure safe administration of the medications.   DRIVING: Regarding driving, in patients with progressive memory problems, driving will be impaired. We advise to have someone else do the driving if trouble finding directions or if minor accidents are reported. Independent driving assessment is available to determine safety of driving.   If you are interested in the driving assessment, you can contact  the following:  [employer] in [place]  [place] [phone] or [phone]      [place] refers to food and lifestyle choices that are based on the traditions of countries located on the [employer]. This way of eating has been shown to help prevent certain conditions and improve outcomes for people who have chronic diseases, like kidney disease and heart disease. What are tips for following this plan? Lifestyle  Cook and eat meals together with your family, when possible. Drink enough fluid to keep your urine clear or pale yellow. Be physically active every day. This includes: Aerobic exercise like running or swimming. Leisure activities like gardening, walking, or housework. Get 7-8 hours of sleep each night. If recommended by your health care provider, drink red wine in moderation. This means 1 glass a day for nonpregnant women and 2 glasses a day for men. A glass of wine equals 5 oz (150 mL). Reading food labels  Check the serving size of packaged foods. For foods such as rice and pasta, the serving size refers to the amount of cooked product, not dry. Check the total fat in packaged foods. Avoid foods that have saturated fat or trans fats. Check the ingredients list for added sugars, such as corn syrup. Shopping  At the grocery store, buy most of your food from the areas near the walls of the store. This includes: Fresh fruits and vegetables (produce). Grains,  beans, nuts, and seeds. Some of these may be available in unpackaged forms or large amounts (in bulk). Fresh seafood. Poultry and eggs. Low-fat dairy products. Buy whole ingredients instead of prepackaged foods. Buy fresh fruits and vegetables in-season from local farmers markets. Buy frozen fruits and vegetables in resealable bags. If you do not have access to  quality fresh seafood, buy precooked frozen shrimp or canned fish, such as tuna, salmon, or sardines. Buy small amounts of raw or cooked vegetables, salads, or olives from the deli or salad bar at your store. Stock your pantry so you always have certain foods on hand, such as olive oil, canned tuna, canned tomatoes, rice, pasta, and beans. Cooking  Cook foods with extra-virgin olive oil instead of using butter or other vegetable oils. Have meat as a side dish, and have vegetables or grains as your main dish. This means having meat in small portions or adding small amounts of meat to foods like pasta or stew. Use beans or vegetables instead of meat in common dishes like chili or lasagna. Experiment with different cooking methods. Try roasting or broiling vegetables instead of steaming or sauteing them. Add frozen vegetables to soups, stews, pasta, or rice. Add nuts or seeds for added healthy fat at each meal. You can add these to yogurt, salads, or vegetable dishes. Marinate fish or vegetables using olive oil, lemon juice, garlic, and fresh herbs. Meal planning  Plan to eat 1 vegetarian meal one day each week. Try to work up to 2 vegetarian meals, if possible. Eat seafood 2 or more times a week. Have healthy snacks readily available, such as: Vegetable sticks with hummus. Greek yogurt. Fruit and nut trail mix. Eat balanced meals throughout the week. This includes: Fruit: 2-3 servings a day Vegetables: 4-5 servings a day Low-fat dairy: 2 servings a day Fish, poultry, or lean meat: 1 serving a day Beans and legumes: 2 or more servings a week Nuts and seeds: 1-2 servings a day Whole grains: 6-8 servings a day Extra-virgin olive oil: 3-4 servings a day Limit red meat and sweets to only a few servings a month What are my food choices? Mediterranean diet Recommended Grains: Whole-grain pasta. Brown rice. Bulgar wheat. Polenta. Couscous. Whole-wheat bread. [place]. Vegetables:  Artichokes. Beets. Broccoli. Cabbage. Carrots. Eggplant. Green beans. Chard. Kale. Spinach. Onions. Leeks. Peas. Squash. Tomatoes. Peppers. Radishes. Fruits: Apples. Apricots. Avocado. Berries. Bananas. Cherries. Dates. Figs. Grapes. Lemons. Melon. Oranges. Peaches. Plums. Pomegranate. Meats and other protein foods: Beans. Almonds. Sunflower seeds. Pine nuts. Peanuts. [place]. Salmon. Scallops. Shrimp. [place]. Tilapia. Clams. Oysters. Eggs. Dairy: Low-fat milk. Cheese. Greek yogurt. Beverages: Water. Red wine. Herbal tea. Fats and oils: Extra virgin olive oil. Avocado oil. Grape seed oil. Sweets and desserts: [place] yogurt with honey. Baked apples. Poached pears. Trail mix. Seasoning and other foods: Basil. Cilantro. Coriander. Cumin. Mint. Parsley. Sage. Rosemary. Tarragon. Garlic. Oregano. Thyme. Pepper. Balsalmic vinegar. Tahini. Hummus. Tomato sauce. Olives. Mushrooms. Limit these Grains: Prepackaged pasta or rice dishes. Prepackaged cereal with added sugar. Vegetables: Deep fried potatoes (french fries). Fruits: Fruit canned in syrup. Meats and other protein foods: Beef. Pork. Lamb. Poultry with skin. Hot dogs. [place] Salines. Dairy: Ice cream. Sour cream. Whole milk. Beverages: Juice. Sugar-sweetened soft drinks. Beer. Liquor and spirits. Fats and oils: Butter. Canola oil. Vegetable oil. Beef fat (tallow). Lard. Sweets and desserts: Cookies. Cakes. Pies. Candy. Seasoning and other foods: Mayonnaise. Premade sauces and marinades. The items listed may not be a complete list. Talk with your dietitian about  what dietary choices are right for you. Summary The Mediterranean diet includes both food and lifestyle choices. Eat a variety of fresh fruits and vegetables, beans, nuts, seeds, and whole grains. Limit the amount of red meat and sweets that you eat. Talk with your health care provider about whether it is safe for you to drink red wine in moderation. This means 1 glass a day for nonpregnant women and 2  glasses a day for men. A glass of wine equals 5 oz (150 mL). This information is not intended to replace advice given to you by your health care provider. Make sure you discuss any questions you have with your health care provider. Document Released: 10/16/2015 Document Revised: 11/18/2015 Document Reviewed: 10/16/2015 Elsevier Interactive Patient Education  2017 [employer].
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
## 2022-11-12 ENCOUNTER — Ambulatory Visit: Payer: Medicare PPO | Admitting: Physician Assistant

## 2022-11-12 ENCOUNTER — Encounter: Payer: Self-pay | Admitting: Physician Assistant

## 2022-11-12 VITALS — BP 122/64 | HR 67 | Ht 60.0 in | Wt 178.0 lb

## 2022-11-12 DIAGNOSIS — F0280 Dementia in other diseases classified elsewhere without behavioral disturbance: Secondary | ICD-10-CM

## 2022-11-12 DIAGNOSIS — R251 Tremor, unspecified: Secondary | ICD-10-CM | POA: Diagnosis not present

## 2022-11-12 DIAGNOSIS — G309 Alzheimer's disease, unspecified: Secondary | ICD-10-CM

## 2022-11-12 MED ORDER — MEMANTINE HCL 10 MG PO TABS: 10.0000 mg | ORAL_TABLET | Freq: Two times a day (BID) | ORAL | 3 refills | Status: DC

## 2022-11-12 MED ORDER — PRIMIDONE 50 MG PO TABS: 100.0000 mg | ORAL_TABLET | Freq: Every evening | ORAL | 3 refills | Status: DC

## 2022-11-12 NOTE — Patient Instructions (Addendum)
It was a pleasure to see you today at our office.   Recommendations:  Follow up 3/7  at 11:30  Continue  memantine to 10 mg two times a day Continue primidone 100 mg at night  Recommend stress management     Whom to call:  Memory  decline, memory medications: Call out office [phone]   For psychiatric meds, mood meds: Please have your primary care physician manage these medications.   Counseling regarding caregiver distress, including caregiver depression, anxiety and issues regarding community resources, adult day care programs, adult living facilities, or memory care questions:   Feel free to contact Misty Lisabeth Register, Social Worker at [phone]   For assessment of decision of mental capacity and competency:  Call Dr. Erick Blinks, geriatric psychiatrist at [phone]  For guidance in geriatric dementia issues please call [facility] [phone]  If you have any severe symptoms of a stroke, or other severe issues such as confusion,severe chills or fever, etc call 911 or go to the ER as you may need to be evaluate further   Feel free to visit Facebook page " [email or web address]" for tips of how to care for people with memory problems.      RECOMMENDATIONS FOR ALL PATIENTS WITH MEMORY PROBLEMS: 1. Continue to exercise (Recommend 30 minutes of walking everyday, or 3 hours every week) 2. Increase social interactions - continue going to [place] and enjoy social gatherings with friends and family 3. Eat healthy, avoid fried foods and eat more fruits and vegetables 4. Maintain adequate blood pressure, blood sugar, and blood cholesterol level. Reducing the risk of stroke and cardiovascular disease also helps promoting better memory. 5. Avoid stressful situations. Live a simple life and avoid aggravations. Organize your time and prepare for the next day in anticipation. 6. Sleep well, avoid any interruptions of sleep and avoid any distractions in the bedroom that may  interfere with adequate sleep quality 7. Avoid sugar, avoid sweets as there is a strong link between excessive sugar intake, diabetes, and cognitive impairment We discussed the Mediterranean diet, which has been shown to help patients reduce the risk of progressive memory disorders and reduces cardiovascular risk. This includes eating fish, eat fruits and green leafy vegetables, nuts like almonds and hazelnuts, walnuts, and also use olive oil. Avoid fast foods and fried foods as much as possible. Avoid sweets and sugar as sugar use has been linked to worsening of memory function.  There is always a concern of gradual progression of memory problems. If this is the case, then we may need to adjust level of care according to patient needs. Support, both to the patient and caregiver, should then be put into place.    The Alzheimer's Association is here all day, every day for people facing Alzheimer's disease through our free 24/7 Helpline: [phone]. The Helpline provides reliable information and support to all those who need assistance, such as individuals living with memory loss, Alzheimer's or other dementia, caregivers, health care professionals and the public.  Our highly trained and knowledgeable staff can help you with: Understanding memory loss, dementia and Alzheimer's  Medications and other treatment options  General information about aging and brain health  Skills to provide quality care and to find the best care from professionals  Legal, financial and living-arrangement decisions Our Helpline also features: Confidential care consultation provided by master's level clinicians who can help with decision-making support, crisis assistance and education on issues families face every day  Help in a caller's preferred  language using our translation service that features more than 200 languages and dialects  Referrals to local community programs, services and ongoing support     FALL  PRECAUTIONS: Be cautious when walking. Scan the area for obstacles that may increase the risk of trips and falls. When getting up in the mornings, sit up at the edge of the bed for a few minutes before getting out of bed. Consider elevating the bed at the head end to avoid drop of blood pressure when getting up. Walk always in a well-lit room (use night lights in the walls). Avoid area rugs or power cords from appliances in the middle of the walkways. Use a walker or a cane if necessary and consider physical therapy for balance exercise. Get your eyesight checked regularly.  FINANCIAL OVERSIGHT: Supervision, especially oversight when making financial decisions or transactions is also recommended.  HOME SAFETY: Consider the safety of the kitchen when operating appliances like stoves, microwave oven, and blender. Consider having supervision and share cooking responsibilities until no longer able to participate in those. Accidents with firearms and other hazards in the house should be identified and addressed as well.   ABILITY TO BE LEFT ALONE: If patient is unable to contact 911 operator, consider using LifeLine, or when the need is there, arrange for someone to stay with patients. Smoking is a fire hazard, consider supervision or cessation. Risk of wandering should be assessed by caregiver and if detected at any point, supervision and safe proof recommendations should be instituted.  MEDICATION SUPERVISION: Inability to self-administer medication needs to be constantly addressed. Implement a mechanism to ensure safe administration of the medications.   DRIVING: Regarding driving, in patients with progressive memory problems, driving will be impaired. We advise to have someone else do the driving if trouble finding directions or if minor accidents are reported. Independent driving assessment is available to determine safety of driving.   If you are interested in the driving assessment, you can contact  the following:  [employer] in [place] [phone]  [facility] [phone]  [facility] [phone] or [phone]      Mediterranean Diet A Mediterranean diet refers to food and lifestyle choices that are based on the traditions of countries located on the [employer]. This way of eating has been shown to help prevent certain conditions and improve outcomes for people who have chronic diseases, like kidney disease and heart disease. What are tips for following this plan? Lifestyle  Cook and eat meals together with your family, when possible. Drink enough fluid to keep your urine clear or pale yellow. Be physically active every day. This includes: Aerobic exercise like running or swimming. Leisure activities like gardening, walking, or housework. Get 7-8 hours of sleep each night. If recommended by your health care provider, drink red wine in moderation. This means 1 glass a day for nonpregnant women and 2 glasses a day for men. A glass of wine equals 5 oz (150 mL). Reading food labels  Check the serving size of packaged foods. For foods such as rice and pasta, the serving size refers to the amount of cooked product, not dry. Check the total fat in packaged foods. Avoid foods that have saturated fat or trans fats. Check the ingredients list for added sugars, such as corn syrup. Shopping  At the grocery store, buy most of your food from the areas near the walls of the store. This includes: Fresh fruits and vegetables (produce).  Grains, beans, nuts, and seeds. Some of these may be available in unpackaged forms or large amounts (in bulk). Fresh seafood. Poultry and eggs. Low-fat dairy products. Buy whole ingredients instead of prepackaged foods. Buy fresh fruits and vegetables in-season from local farmers markets. Buy frozen fruits and vegetables in resealable bags. If you do not have access to  quality fresh seafood, buy precooked frozen shrimp or canned fish, such as tuna, salmon, or sardines. Buy small amounts of raw or cooked vegetables, salads, or olives from the deli or salad bar at your store. Stock your pantry so you always have certain foods on hand, such as olive oil, canned tuna, canned tomatoes, rice, pasta, and beans. Cooking  Cook foods with extra-virgin olive oil instead of using butter or other vegetable oils. Have meat as a side dish, and have vegetables or grains as your main dish. This means having meat in small portions or adding small amounts of meat to foods like pasta or stew. Use beans or vegetables instead of meat in common dishes like chili or lasagna. Experiment with different cooking methods. Try roasting or broiling vegetables instead of steaming or sauteing them. Add frozen vegetables to soups, stews, pasta, or rice. Add nuts or seeds for added healthy fat at each meal. You can add these to yogurt, salads, or vegetable dishes. Marinate fish or vegetables using olive oil, lemon juice, garlic, and fresh herbs. Meal planning  Plan to eat 1 vegetarian meal one day each week. Try to work up to 2 vegetarian meals, if possible. Eat seafood 2 or more times a week. Have healthy snacks readily available, such as: Vegetable sticks with hummus. Greek yogurt. Fruit and nut trail mix. Eat balanced meals throughout the week. This includes: Fruit: 2-3 servings a day Vegetables: 4-5 servings a day Low-fat dairy: 2 servings a day Fish, poultry, or lean meat: 1 serving a day Beans and legumes: 2 or more servings a week Nuts and seeds: 1-2 servings a day Whole grains: 6-8 servings a day Extra-virgin olive oil: 3-4 servings a day Limit red meat and sweets to only a few servings a month What are my food choices? Mediterranean diet Recommended Grains: Whole-grain pasta. Brown rice. Bulgar wheat. Polenta. Couscous. Whole-wheat bread. Orpah Cobb. Vegetables:  Artichokes. Beets. Broccoli. Cabbage. Carrots. Eggplant. Green beans. Chard. Kale. Spinach. Onions. Leeks. Peas. Squash. Tomatoes. Peppers. Radishes. Fruits: Apples. Apricots. Avocado. Berries. Bananas. Cherries. Dates. Figs. Grapes. Lemons. Melon. Oranges. Peaches. Plums. Pomegranate. Meats and other protein foods: Beans. Almonds. Sunflower seeds. Pine nuts. Peanuts. Cod. Salmon. Scallops. Shrimp. Tuna. Tilapia. Clams. Oysters. Eggs. Dairy: Low-fat milk. Cheese. Greek yogurt. Beverages: Water. Red wine. Herbal tea. Fats and oils: Extra virgin olive oil. Avocado oil. Grape seed oil. Sweets and desserts: [place] yogurt with honey. Baked apples. Poached pears. Trail mix. Seasoning and other foods: Basil. Cilantro. Coriander. Cumin. Mint. Parsley. Sage. Rosemary. Tarragon. Garlic. Oregano. Thyme. Pepper. Balsalmic vinegar. Tahini. Hummus. Tomato sauce. Olives. Mushrooms. Limit these Grains: Prepackaged pasta or rice dishes. Prepackaged cereal with added sugar. Vegetables: Deep fried potatoes (french fries). Fruits: Fruit canned in syrup. Meats and other protein foods: Beef. Pork. Lamb. Poultry with skin. Hot dogs. Tomasa Blase. Dairy: Ice cream. Sour cream. Whole milk. Beverages: Juice. Sugar-sweetened soft drinks. Beer. Liquor and spirits. Fats and oils: Butter. Canola oil. Vegetable oil. Beef fat (tallow). Lard. Sweets and desserts: Cookies. Cakes. Pies. Candy. Seasoning and other foods: Mayonnaise. Premade sauces and marinades. The items listed may not be a complete list. Talk with your dietitian  about what dietary choices are right for you. Summary The Mediterranean diet includes both food and lifestyle choices. Eat a variety of fresh fruits and vegetables, beans, nuts, seeds, and whole grains. Limit the amount of red meat and sweets that you eat. Talk with your health care provider about whether it is safe for you to drink red wine in moderation. This means 1 glass a day for nonpregnant women and 2  glasses a day for men. A glass of wine equals 5 oz (150 mL). This information is not intended to replace advice given to you by your health care provider. Make sure you discuss any questions you have with your health care provider. Document Released: 10/16/2015 Document Revised: 11/18/2015 Document Reviewed: 10/16/2015 Elsevier Interactive Patient Education  2017 [employer].
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
## 2023-03-07 ENCOUNTER — Encounter: Payer: Self-pay | Admitting: Physician Assistant

## 2023-05-10 ENCOUNTER — Ambulatory Visit: Payer: Medicare PPO | Admitting: Physician Assistant

## 2023-05-13 ENCOUNTER — Ambulatory Visit: Payer: Medicare PPO | Admitting: Physician Assistant

## 2023-06-22 ENCOUNTER — Encounter: Payer: Self-pay | Admitting: Physician Assistant

## 2023-06-22 ENCOUNTER — Ambulatory Visit: Admitting: Physician Assistant

## 2023-06-22 VITALS — BP 120/80 | HR 80 | Resp 20 | Ht 60.0 in | Wt 167.0 lb

## 2023-06-22 DIAGNOSIS — G309 Alzheimer's disease, unspecified: Secondary | ICD-10-CM

## 2023-06-22 DIAGNOSIS — R251 Tremor, unspecified: Secondary | ICD-10-CM | POA: Diagnosis not present

## 2023-06-22 DIAGNOSIS — F0280 Dementia in other diseases classified elsewhere without behavioral disturbance: Secondary | ICD-10-CM

## 2023-06-22 NOTE — Patient Instructions (Addendum)
 It was a pleasure to see you today at our office.   Recommendations:   Continue  memantine to 10 mg two times a day Continue primidone 100 mg at night  Follow up Oct 16 at 1 pm     Whom to call:  Memory  decline, memory medications: Call out office [phone]   For psychiatric meds, mood meds: Please have your primary care physician manage these medications.   Counseling regarding caregiver distress, including caregiver depression, anxiety and issues regarding community resources, adult day care programs, adult living facilities, or memory care questions:   Feel free to contact Misty Court Distance, Social Worker at [phone]   For assessment of decision of mental capacity and competency:  Call Dr. Laverne Potter, geriatric psychiatrist at [phone]  For guidance in geriatric dementia issues please call [facility] [phone]  If you have any severe symptoms of a stroke, or other severe issues such as confusion,severe chills or fever, etc call 911 or go to the ER as you may need to be evaluate further   Feel free to visit Facebook page " [email or web address]" for tips of how to care for people with memory problems.      RECOMMENDATIONS FOR ALL PATIENTS WITH MEMORY PROBLEMS: 1. Continue to exercise (Recommend 30 minutes of walking everyday, or 3 hours every week) 2. Increase social interactions - continue going to [place] and enjoy social gatherings with friends and family 3. Eat healthy, avoid fried foods and eat more fruits and vegetables 4. Maintain adequate blood pressure, blood sugar, and blood cholesterol level. Reducing the risk of stroke and cardiovascular disease also helps promoting better memory. 5. Avoid stressful situations. Live a simple life and avoid aggravations. Organize your time and prepare for the next day in anticipation. 6. Sleep well, avoid any interruptions of sleep and avoid any distractions in the bedroom that may interfere with adequate sleep  quality 7. Avoid sugar, avoid sweets as there is a strong link between excessive sugar intake, diabetes, and cognitive impairment We discussed the Mediterranean diet, which has been shown to help patients reduce the risk of progressive memory disorders and reduces cardiovascular risk. This includes eating fish, eat fruits and green leafy vegetables, nuts like almonds and hazelnuts, walnuts, and also use olive oil. Avoid fast foods and fried foods as much as possible. Avoid sweets and sugar as sugar use has been linked to worsening of memory function.  There is always a concern of gradual progression of memory problems. If this is the case, then we may need to adjust level of care according to patient needs. Support, both to the patient and caregiver, should then be put into place.    The Alzheimer's Association is here all day, every day for people facing Alzheimer's disease through our free 24/7 Helpline: [phone]. The Helpline provides reliable information and support to all those who need assistance, such as individuals living with memory loss, Alzheimer's or other dementia, caregivers, health care professionals and the public.  Our highly trained and knowledgeable staff can help you with: Understanding memory loss, dementia and Alzheimer's  Medications and other treatment options  General information about aging and brain health  Skills to provide quality care and to find the best care from professionals  Legal, financial and living-arrangement decisions Our Helpline also features: Confidential care consultation provided by master's level clinicians who can help with decision-making support, crisis assistance and education on issues families face every day  Help in a caller's preferred language using  our translation service that features more than 200 languages and dialects  Referrals to local community programs, services and ongoing support     FALL PRECAUTIONS: Be cautious when  walking. Scan the area for obstacles that may increase the risk of trips and falls. When getting up in the mornings, sit up at the edge of the bed for a few minutes before getting out of bed. Consider elevating the bed at the head end to avoid drop of blood pressure when getting up. Walk always in a well-lit room (use night lights in the walls). Avoid area rugs or power cords from appliances in the middle of the walkways. Use a walker or a cane if necessary and consider physical therapy for balance exercise. Get your eyesight checked regularly.  FINANCIAL OVERSIGHT: Supervision, especially oversight when making financial decisions or transactions is also recommended.  HOME SAFETY: Consider the safety of the kitchen when operating appliances like stoves, microwave oven, and blender. Consider having supervision and share cooking responsibilities until no longer able to participate in those. Accidents with firearms and other hazards in the house should be identified and addressed as well.   ABILITY TO BE LEFT ALONE: If patient is unable to contact 911 operator, consider using LifeLine, or when the need is there, arrange for someone to stay with patients. Smoking is a fire hazard, consider supervision or cessation. Risk of wandering should be assessed by caregiver and if detected at any point, supervision and safe proof recommendations should be instituted.  MEDICATION SUPERVISION: Inability to self-administer medication needs to be constantly addressed. Implement a mechanism to ensure safe administration of the medications.   DRIVING: Regarding driving, in patients with progressive memory problems, driving will be impaired. We advise to have someone else do the driving if trouble finding directions or if minor accidents are reported. Independent driving assessment is available to determine safety of driving.   If you are interested in the driving assessment, you can contact the following:  The  [employer] in [place] [phone]  [facility] [phone]  [facility] [phone] or [phone]      Mediterranean Diet A Mediterranean diet refers to food and lifestyle choices that are based on the traditions of countries located on the [employer]. This way of eating has been shown to help prevent certain conditions and improve outcomes for people who have chronic diseases, like kidney disease and heart disease. What are tips for following this plan? Lifestyle  Cook and eat meals together with your family, when possible. Drink enough fluid to keep your urine clear or pale yellow. Be physically active every day. This includes: Aerobic exercise like running or swimming. Leisure activities like gardening, walking, or housework. Get 7-8 hours of sleep each night. If recommended by your health care provider, drink red wine in moderation. This means 1 glass a day for nonpregnant women and 2 glasses a day for men. A glass of wine equals 5 oz (150 mL). Reading food labels  Check the serving size of packaged foods. For foods such as rice and pasta, the serving size refers to the amount of cooked product, not dry. Check the total fat in packaged foods. Avoid foods that have saturated fat or trans fats. Check the ingredients list for added sugars, such as corn syrup. Shopping  At the grocery store, buy most of your food from the areas near the walls of the store. This includes: Fresh fruits and vegetables (produce). Grains, beans,  nuts, and seeds. Some of these may be available in unpackaged forms or large amounts (in bulk). Fresh seafood. Poultry and eggs. Low-fat dairy products. Buy whole ingredients instead of prepackaged foods. Buy fresh fruits and vegetables in-season from local farmers markets. Buy frozen fruits and vegetables in resealable bags. If you do not have access to quality fresh seafood,  buy precooked frozen shrimp or canned fish, such as tuna, salmon, or sardines. Buy small amounts of raw or cooked vegetables, salads, or olives from the deli or salad bar at your store. Stock your pantry so you always have certain foods on hand, such as olive oil, canned tuna, canned tomatoes, rice, pasta, and beans. Cooking  Cook foods with extra-virgin olive oil instead of using butter or other vegetable oils. Have meat as a side dish, and have vegetables or grains as your main dish. This means having meat in small portions or adding small amounts of meat to foods like pasta or stew. Use beans or vegetables instead of meat in common dishes like chili or lasagna. Experiment with different cooking methods. Try roasting or broiling vegetables instead of steaming or sauteing them. Add frozen vegetables to soups, stews, pasta, or rice. Add nuts or seeds for added healthy fat at each meal. You can add these to yogurt, salads, or vegetable dishes. Marinate fish or vegetables using olive oil, lemon juice, garlic, and fresh herbs. Meal planning  Plan to eat 1 vegetarian meal one day each week. Try to work up to 2 vegetarian meals, if possible. Eat seafood 2 or more times a week. Have healthy snacks readily available, such as: Vegetable sticks with hummus. Greek yogurt. Fruit and nut trail mix. Eat balanced meals throughout the week. This includes: Fruit: 2-3 servings a day Vegetables: 4-5 servings a day Low-fat dairy: 2 servings a day Fish, poultry, or lean meat: 1 serving a day Beans and legumes: 2 or more servings a week Nuts and seeds: 1-2 servings a day Whole grains: 6-8 servings a day Extra-virgin olive oil: 3-4 servings a day Limit red meat and sweets to only a few servings a month What are my food choices? Mediterranean diet Recommended Grains: Whole-grain pasta. Brown rice. Bulgar wheat. Polenta. Couscous. Whole-wheat bread. Dwyane Glad. Vegetables: Artichokes. Beets. Broccoli.  Cabbage. Carrots. Eggplant. Green beans. Chard. Kale. Spinach. Onions. Leeks. Peas. Squash. Tomatoes. Peppers. Radishes. Fruits: Apples. Apricots. Avocado. Berries. Bananas. Cherries. Dates. Figs. Grapes. Lemons. Melon. Oranges. Peaches. Plums. Pomegranate. Meats and other protein foods: Beans. Almonds. Sunflower seeds. Pine nuts. Peanuts. Cod. Salmon. Scallops. Shrimp. Tuna. Tilapia. Clams. Oysters. Eggs. Dairy: Low-fat milk. Cheese. Greek yogurt. Beverages: Water. Red wine. Herbal tea. Fats and oils: Extra virgin olive oil. Avocado oil. Grape seed oil. Sweets and desserts: [place] yogurt with honey. Baked apples. Poached pears. Trail mix. Seasoning and other foods: Basil. Cilantro. Coriander. Cumin. Mint. Parsley. Sage. Rosemary. Tarragon. Garlic. Oregano. Thyme. Pepper. Balsalmic vinegar. Tahini. Hummus. Tomato sauce. Olives. Mushrooms. Limit these Grains: Prepackaged pasta or rice dishes. Prepackaged cereal with added sugar. Vegetables: Deep fried potatoes (french fries). Fruits: Fruit canned in syrup. Meats and other protein foods: Beef. Pork. Lamb. Poultry with skin. Hot dogs. Helene Loader. Dairy: Ice cream. Sour cream. Whole milk. Beverages: Juice. Sugar-sweetened soft drinks. Beer. Liquor and spirits. Fats and oils: Butter. Canola oil. Vegetable oil. Beef fat (tallow). Lard. Sweets and desserts: Cookies. Cakes. Pies. Candy. Seasoning and other foods: Mayonnaise. Premade sauces and marinades. The items listed may not be a complete list. Talk with your dietitian about what  dietary choices are right for you. Summary The Mediterranean diet includes both food and lifestyle choices. Eat a variety of fresh fruits and vegetables, beans, nuts, seeds, and whole grains. Limit the amount of red meat and sweets that you eat. Talk with your health care provider about whether it is safe for you to drink red wine in moderation. This means 1 glass a day for nonpregnant women and 2 glasses a day for men. A glass  of wine equals 5 oz (150 mL). This information is not intended to replace advice given to you by your health care provider. Make sure you discuss any questions you have with your health care provider. Document Released: 10/16/2015 Document Revised: 11/18/2015 Document Reviewed: 10/16/2015 Elsevier Interactive Patient Education  2017 [employer].
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
## 2023-07-04 NOTE — Telephone Encounter (Signed)
 Please contact the primary for mood control,. She may need psychiatry input. Thanks

## 2023-11-19 ENCOUNTER — Other Ambulatory Visit: Payer: Self-pay | Admitting: Physician Assistant

## 2023-11-25 ENCOUNTER — Other Ambulatory Visit: Payer: Self-pay | Admitting: Physician Assistant

## 2023-12-16 ENCOUNTER — Other Ambulatory Visit: Payer: Self-pay | Admitting: Physician Assistant

## 2023-12-22 ENCOUNTER — Ambulatory Visit: Admitting: Physician Assistant

## 2023-12-22 ENCOUNTER — Encounter: Payer: Self-pay | Admitting: Physician Assistant

## 2023-12-22 VITALS — BP 141/66 | HR 68 | Ht 61.0 in | Wt 163.0 lb

## 2023-12-22 DIAGNOSIS — F0280 Dementia in other diseases classified elsewhere without behavioral disturbance: Secondary | ICD-10-CM | POA: Insufficient documentation

## 2023-12-22 DIAGNOSIS — R251 Tremor, unspecified: Secondary | ICD-10-CM

## 2023-12-22 DIAGNOSIS — G309 Alzheimer's disease, unspecified: Secondary | ICD-10-CM | POA: Diagnosis not present

## 2023-12-22 MED ORDER — MEMANTINE HCL 10 MG PO TABS: 10.0000 mg | ORAL_TABLET | Freq: Two times a day (BID) | ORAL | 3 refills | Status: AC

## 2023-12-22 MED ORDER — PRIMIDONE 50 MG PO TABS: 100.0000 mg | ORAL_TABLET | Freq: Every day | ORAL | 3 refills | Status: AC

## 2023-12-22 MED ORDER — RIVASTIGMINE TARTRATE 1.5 MG PO CAPS: ORAL_CAPSULE | ORAL | 11 refills | Status: AC
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
## 2023-12-22 NOTE — Patient Instructions (Signed)
 It was a pleasure to see you today at our office.   Recommendations:   Continue  memantine  to 10 mg two times a day Start rivastigmine milligrams.  Take 1 Nightly for 2 weeks then increase to 1 cap twice daily if tolerated.  Continue primidone  100 mg at night  Follow up 6 month     Whom to call:  Memory  decline, memory medications: Call out office [phone]   For psychiatric meds, mood meds: Please have your primary care physician manage these medications.   Counseling regarding caregiver distress, including caregiver depression, anxiety and issues regarding community resources, adult day care programs, adult living facilities, or memory care questions:   Feel free to contact Misty Waddell Simmer, Social Worker at [phone]   For assessment of decision of mental capacity and competency:  Call Dr. Rosaline Nine, geriatric psychiatrist at [phone]  For guidance in geriatric dementia issues please call [facility] [phone]  If you have any severe symptoms of a stroke, or other severe issues such as confusion,severe chills or fever, etc call 911 or go to the ER as you may need to be evaluate further   Feel free to visit Facebook page  [employer] for tips of how to care for people with memory problems.      RECOMMENDATIONS FOR ALL PATIENTS WITH MEMORY PROBLEMS: 1. Continue to exercise (Recommend 30 minutes of walking everyday, or 3 hours every week) 2. Increase social interactions - continue going to [place] and enjoy social gatherings with friends and family 3. Eat healthy, avoid fried foods and eat more fruits and vegetables 4. Maintain adequate blood pressure, blood sugar, and blood cholesterol level. Reducing the risk of stroke and cardiovascular disease also helps promoting better memory. 5. Avoid stressful situations. Live a simple life and avoid aggravations. Organize your time and prepare for the next day in anticipation. 6. Sleep well, avoid any  interruptions of sleep and avoid any distractions in the bedroom that may interfere with adequate sleep quality 7. Avoid sugar, avoid sweets as there is a strong link between excessive sugar intake, diabetes, and cognitive impairment We discussed the Mediterranean diet, which has been shown to help patients reduce the risk of progressive memory disorders and reduces cardiovascular risk. This includes eating fish, eat fruits and green leafy vegetables, nuts like almonds and hazelnuts, walnuts, and also use olive oil. Avoid fast foods and fried foods as much as possible. Avoid sweets and sugar as sugar use has been linked to worsening of memory function.  There is always a concern of gradual progression of memory problems. If this is the case, then we may need to adjust level of care according to patient needs. Support, both to the patient and caregiver, should then be put into place.    The Alzheimer's Association is here all day, every day for people facing Alzheimer's disease through our free 24/7 Helpline: [phone]. The Helpline provides reliable information and support to all those who need assistance, such as individuals living with memory loss, Alzheimer's or other dementia, caregivers, health care professionals and the public.  Our highly trained and knowledgeable staff can help you with: Understanding memory loss, dementia and Alzheimer's  Medications and other treatment options  General information about aging and brain health  Skills to provide quality care and to find the best care from professionals  Legal, financial and living-arrangement decisions Our Helpline also features: Confidential care consultation provided by master's level clinicians who can help with decision-making support, crisis  assistance and education on issues families face every day  Help in a caller's preferred language using our translation service that features more than 200 languages and dialects  Referrals to  local community programs, services and ongoing support     FALL PRECAUTIONS: Be cautious when walking. Scan the area for obstacles that may increase the risk of trips and falls. When getting up in the mornings, sit up at the edge of the bed for a few minutes before getting out of bed. Consider elevating the bed at the head end to avoid drop of blood pressure when getting up. Walk always in a well-lit room (use night lights in the walls). Avoid area rugs or power cords from appliances in the middle of the walkways. Use a walker or a cane if necessary and consider physical therapy for balance exercise. Get your eyesight checked regularly.  FINANCIAL OVERSIGHT: Supervision, especially oversight when making financial decisions or transactions is also recommended.  HOME SAFETY: Consider the safety of the kitchen when operating appliances like stoves, microwave oven, and blender. Consider having supervision and share cooking responsibilities until no longer able to participate in those. Accidents with firearms and other hazards in the house should be identified and addressed as well.   ABILITY TO BE LEFT ALONE: If patient is unable to contact 911 operator, consider using LifeLine, or when the need is there, arrange for someone to stay with patients. Smoking is a fire hazard, consider supervision or cessation. Risk of wandering should be assessed by caregiver and if detected at any point, supervision and safe proof recommendations should be instituted.  MEDICATION SUPERVISION: Inability to self-administer medication needs to be constantly addressed. Implement a mechanism to ensure safe administration of the medications.   DRIVING: Regarding driving, in patients with progressive memory problems, driving will be impaired. We advise to have someone else do the driving if trouble finding directions or if minor accidents are reported. Independent driving assessment is available to determine safety of  driving.   If you are interested in the driving assessment, you can contact the following:  [facility] [employer] in [place] [phone]  [facility] [phone]  [facility] [phone] or [phone]      Mediterranean Diet A Mediterranean diet refers to food and lifestyle choices that are based on the traditions of countries located on the [employer]. This way of eating has been shown to help prevent certain conditions and improve outcomes for people who have chronic diseases, like kidney disease and heart disease. What are tips for following this plan? Lifestyle  Cook and eat meals together with your family, when possible. Drink enough fluid to keep your urine clear or pale yellow. Be physically active every day. This includes: Aerobic exercise like running or swimming. Leisure activities like gardening, walking, or housework. Get 7-8 hours of sleep each night. If recommended by your health care provider, drink red wine in moderation. This means 1 glass a day for nonpregnant women and 2 glasses a day for men. A glass of wine equals 5 oz (150 mL). Reading food labels  Check the serving size of packaged foods. For foods such as rice and pasta, the serving size refers to the amount of cooked product, not dry. Check the total fat in packaged foods. Avoid foods that have saturated fat or trans fats. Check the ingredients list for added sugars, such as corn syrup. Shopping  At the grocery store, buy most of your food from  the areas near the walls of the store. This includes: Fresh fruits and vegetables (produce). Grains, beans, nuts, and seeds. Some of these may be available in unpackaged forms or large amounts (in bulk). Fresh seafood. Poultry and eggs. Low-fat dairy products. Buy whole ingredients instead of prepackaged foods. Buy fresh fruits and vegetables in-season from local farmers markets. Buy  frozen fruits and vegetables in resealable bags. If you do not have access to quality fresh seafood, buy precooked frozen shrimp or canned fish, such as tuna, salmon, or sardines. Buy small amounts of raw or cooked vegetables, salads, or olives from the deli or salad bar at your store. Stock your pantry so you always have certain foods on hand, such as olive oil, canned tuna, canned tomatoes, rice, pasta, and beans. Cooking  Cook foods with extra-virgin olive oil instead of using butter or other vegetable oils. Have meat as a side dish, and have vegetables or grains as your main dish. This means having meat in small portions or adding small amounts of meat to foods like pasta or stew. Use beans or vegetables instead of meat in common dishes like chili or lasagna. Experiment with different cooking methods. Try roasting or broiling vegetables instead of steaming or sauteing them. Add frozen vegetables to soups, stews, pasta, or rice. Add nuts or seeds for added healthy fat at each meal. You can add these to yogurt, salads, or vegetable dishes. Marinate fish or vegetables using olive oil, lemon juice, garlic, and fresh herbs. Meal planning  Plan to eat 1 vegetarian meal one day each week. Try to work up to 2 vegetarian meals, if possible. Eat seafood 2 or more times a week. Have healthy snacks readily available, such as: Vegetable sticks with hummus. Greek yogurt. Fruit and nut trail mix. Eat balanced meals throughout the week. This includes: Fruit: 2-3 servings a day Vegetables: 4-5 servings a day Low-fat dairy: 2 servings a day Fish, poultry, or lean meat: 1 serving a day Beans and legumes: 2 or more servings a week Nuts and seeds: 1-2 servings a day Whole grains: 6-8 servings a day Extra-virgin olive oil: 3-4 servings a day Limit red meat and sweets to only a few servings a month What are my food choices? Mediterranean diet Recommended Grains: Whole-grain pasta. Brown rice. Bulgar  wheat. Polenta. Couscous. Whole-wheat bread. [place] Madeira. Vegetables: Artichokes. Beets. Broccoli. Cabbage. Carrots. Eggplant. Green beans. Chard. Kale. Spinach. Onions. Leeks. Peas. Squash. Tomatoes. Peppers. Radishes. Fruits: Apples. Apricots. Avocado. Berries. Bananas. Cherries. Dates. Figs. Grapes. Lemons. Melon. Oranges. Peaches. Plums. Pomegranate. Meats and other protein foods: Beans. Almonds. Sunflower seeds. Pine nuts. Peanuts. Cod. Salmon. Scallops. Shrimp. Tuna. Tilapia. Clams. Oysters. Eggs. Dairy: Low-fat milk. Cheese. Greek yogurt. Beverages: Water. Red wine. Herbal tea. Fats and oils: Extra virgin olive oil. Avocado oil. Grape seed oil. Sweets and desserts: [place] yogurt with honey. Baked apples. Poached pears. Trail mix. Seasoning and other foods: Basil. Cilantro. Coriander. Cumin. Mint. Parsley. Sage. Rosemary. Tarragon. Garlic. Oregano. Thyme. Pepper. Balsalmic vinegar. Tahini. Hummus. Tomato sauce. Olives. Mushrooms. Limit these Grains: Prepackaged pasta or rice dishes. Prepackaged cereal with added sugar. Vegetables: Deep fried potatoes (french fries). Fruits: Fruit canned in syrup. Meats and other protein foods: Beef. Pork. Lamb. Poultry with skin. Hot dogs. [place]. Dairy: Ice cream. Sour cream. Whole milk. Beverages: Juice. Sugar-sweetened soft drinks. Beer. Liquor and spirits. Fats and oils: Butter. Canola oil. Vegetable oil. Beef fat (tallow). Lard. Sweets and desserts: Cookies. Cakes. Pies. Candy. Seasoning and other foods: Mayonnaise. Premade sauces  and marinades. The items listed may not be a complete list. Talk with your dietitian about what dietary choices are right for you. Summary The Mediterranean diet includes both food and lifestyle choices. Eat a variety of fresh fruits and vegetables, beans, nuts, seeds, and whole grains. Limit the amount of red meat and sweets that you eat. Talk with your health care provider about whether it is safe for you to drink red  wine in moderation. This means 1 glass a day for nonpregnant women and 2 glasses a day for men. A glass of wine equals 5 oz (150 mL). This information is not intended to replace advice given to you by your health care provider. Make sure you discuss any questions you have with your health care provider. Document Released: 10/16/2015 Document Revised: 11/18/2015 Document Reviewed: 10/16/2015 Elsevier Interactive Patient Education  2017 [employer].

## 2024-06-21 ENCOUNTER — Ambulatory Visit: Admitting: Physician Assistant
